# Patient Record
Sex: Female | Born: 1980 | Race: Black or African American | Hispanic: No | Marital: Single | Smoking: Never smoker
Health system: Southern US, Community
[De-identification: ages and names within clinical notes are randomized; demographics above are authoritative.]

## PROBLEM LIST (undated history)

## (undated) ENCOUNTER — Inpatient Hospital Stay (HOSPITAL_COMMUNITY): Payer: Self-pay

## (undated) DIAGNOSIS — O139 Gestational [pregnancy-induced] hypertension without significant proteinuria, unspecified trimester: Secondary | ICD-10-CM

## (undated) DIAGNOSIS — J45909 Unspecified asthma, uncomplicated: Secondary | ICD-10-CM

## (undated) HISTORY — PX: NO PAST SURGERIES: SHX2092

## (undated) HISTORY — DX: Gestational (pregnancy-induced) hypertension without significant proteinuria, unspecified trimester: O13.9

---
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
## 1999-05-27 ENCOUNTER — Other Ambulatory Visit: Admission: RE | Admit: 1999-05-27 | Discharge: 1999-05-27 | Payer: Self-pay | Admitting: Obstetrics and Gynecology

## 2000-11-10 ENCOUNTER — Other Ambulatory Visit: Admission: RE | Admit: 2000-11-10 | Discharge: 2000-11-10 | Payer: Self-pay | Admitting: *Deleted

## 2000-12-12 ENCOUNTER — Inpatient Hospital Stay (HOSPITAL_COMMUNITY): Admission: AD | Admit: 2000-12-12 | Discharge: 2000-12-12 | Payer: Self-pay | Admitting: *Deleted

## 2001-01-08 ENCOUNTER — Emergency Department (HOSPITAL_COMMUNITY): Admission: EM | Admit: 2001-01-08 | Discharge: 2001-01-08 | Payer: Self-pay | Admitting: Emergency Medicine

## 2001-06-18 ENCOUNTER — Inpatient Hospital Stay (HOSPITAL_COMMUNITY): Admission: AD | Admit: 2001-06-18 | Discharge: 2001-06-18 | Payer: Self-pay | Admitting: Obstetrics

## 2001-07-14 ENCOUNTER — Inpatient Hospital Stay (HOSPITAL_COMMUNITY): Admission: AD | Admit: 2001-07-14 | Discharge: 2001-07-16 | Payer: Self-pay | Admitting: Obstetrics

## 2002-04-11 ENCOUNTER — Other Ambulatory Visit: Admission: RE | Admit: 2002-04-11 | Discharge: 2002-04-11 | Payer: Self-pay | Admitting: Obstetrics and Gynecology

## 2003-05-22 ENCOUNTER — Other Ambulatory Visit: Admission: RE | Admit: 2003-05-22 | Discharge: 2003-05-22 | Payer: Self-pay | Admitting: Obstetrics and Gynecology

## 2006-12-23 ENCOUNTER — Emergency Department (HOSPITAL_COMMUNITY): Admission: EM | Admit: 2006-12-23 | Discharge: 2006-12-23 | Payer: Self-pay | Admitting: Emergency Medicine

## 2008-07-20 ENCOUNTER — Emergency Department (HOSPITAL_COMMUNITY): Admission: EM | Admit: 2008-07-20 | Discharge: 2008-07-20 | Payer: Self-pay | Admitting: Family Medicine

## 2010-06-06 ENCOUNTER — Emergency Department (HOSPITAL_COMMUNITY): Admission: EM | Admit: 2010-06-06 | Discharge: 2010-06-06 | Payer: Self-pay | Admitting: Family Medicine

## 2011-02-05 LAB — POCT URINALYSIS DIP (DEVICE)
Bilirubin Urine: NEGATIVE
Glucose, UA: NEGATIVE mg/dL
Nitrite: NEGATIVE
Protein, ur: NEGATIVE mg/dL
Specific Gravity, Urine: 1.02 (ref 1.005–1.030)

## 2011-02-05 LAB — CBC
HCT: 36.5 % (ref 36.0–46.0)
Hemoglobin: 12.8 g/dL (ref 12.0–15.0)
MCHC: 34.9 g/dL (ref 30.0–36.0)
MCV: 99.1 fL (ref 78.0–100.0)
Platelets: 156 10*3/uL (ref 150–400)

## 2011-02-05 LAB — DIFFERENTIAL
Basophils Absolute: 0 10*3/uL (ref 0.0–0.1)
Eosinophils Absolute: 0 10*3/uL (ref 0.0–0.7)
Lymphocytes Relative: 10 % — ABNORMAL LOW (ref 12–46)
Monocytes Absolute: 0.6 10*3/uL (ref 0.1–1.0)
Neutrophils Relative %: 81 % — ABNORMAL HIGH (ref 43–77)

## 2011-06-28 IMAGING — CR DG CHEST 2V
2 series · 2 of 2 positions shown · non-contrast
Comparison: None.

CLINICAL DATA: Chest pain.  Fever.

CHEST - 2 VIEW

[view not recorded (1 of 2)]
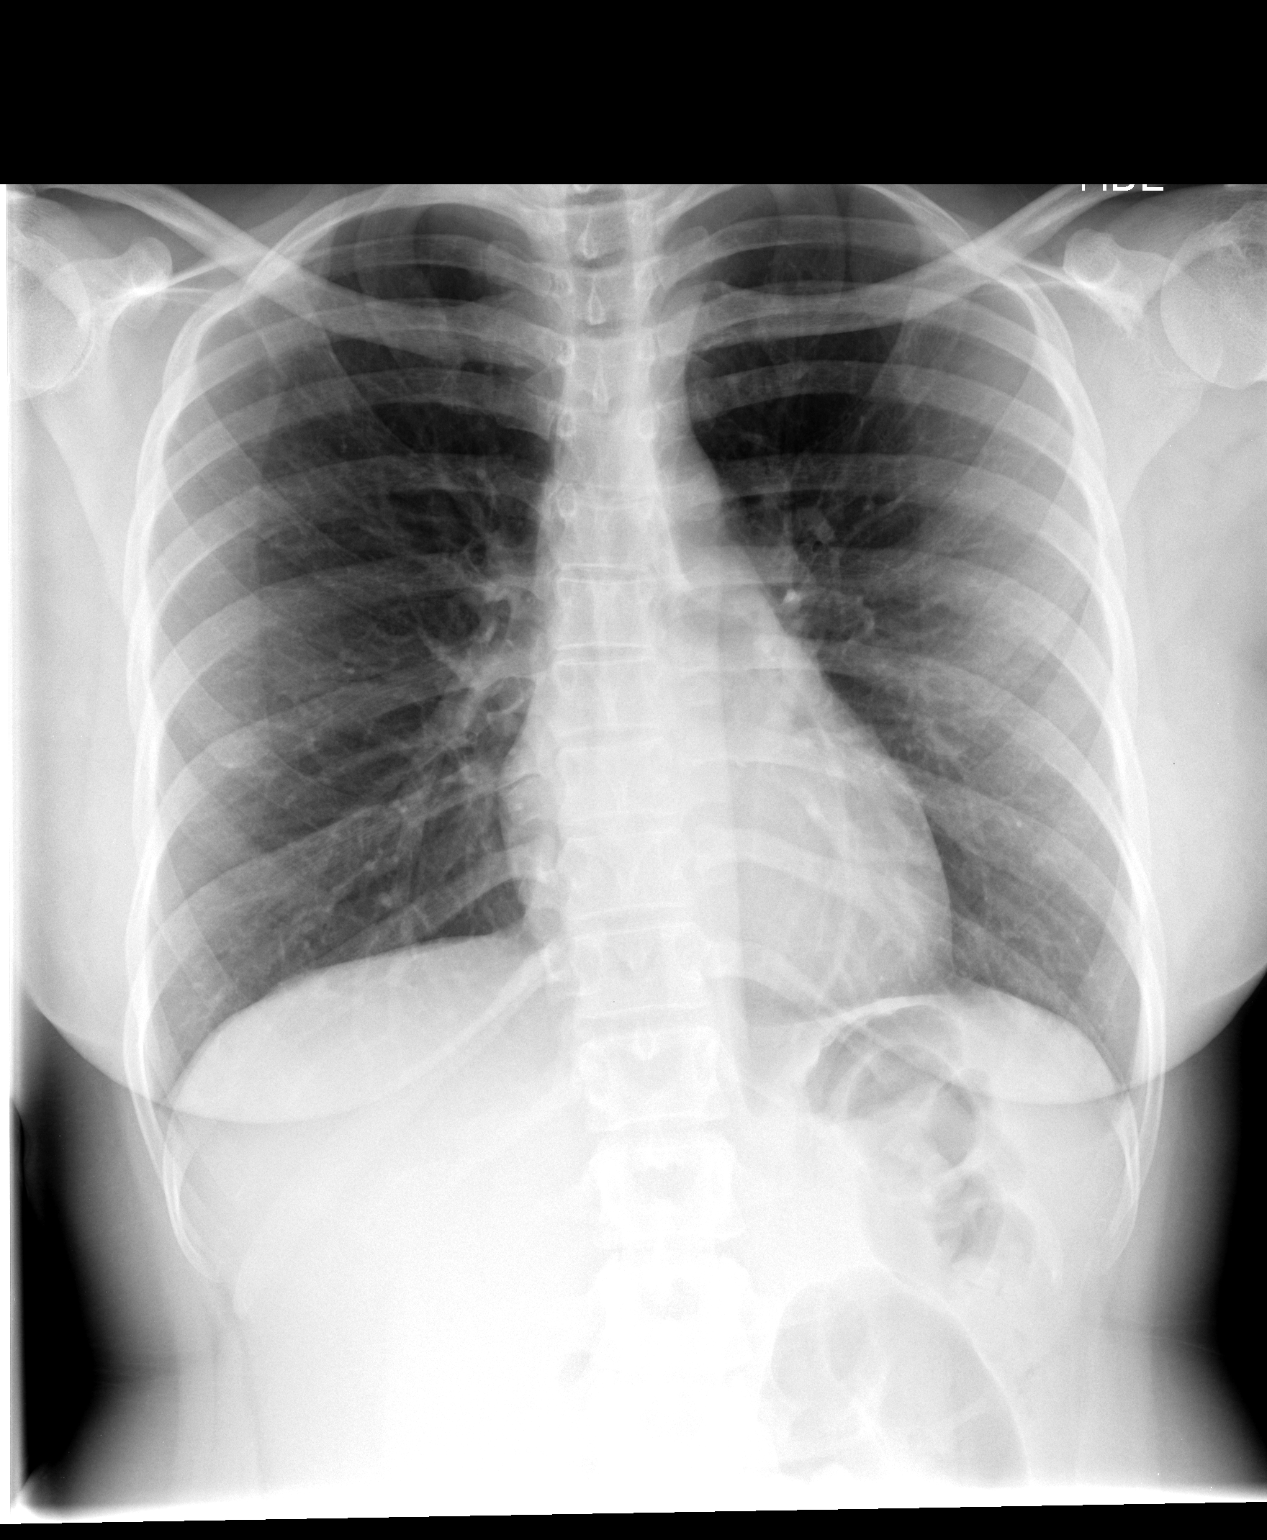

[view not recorded (2 of 2)]
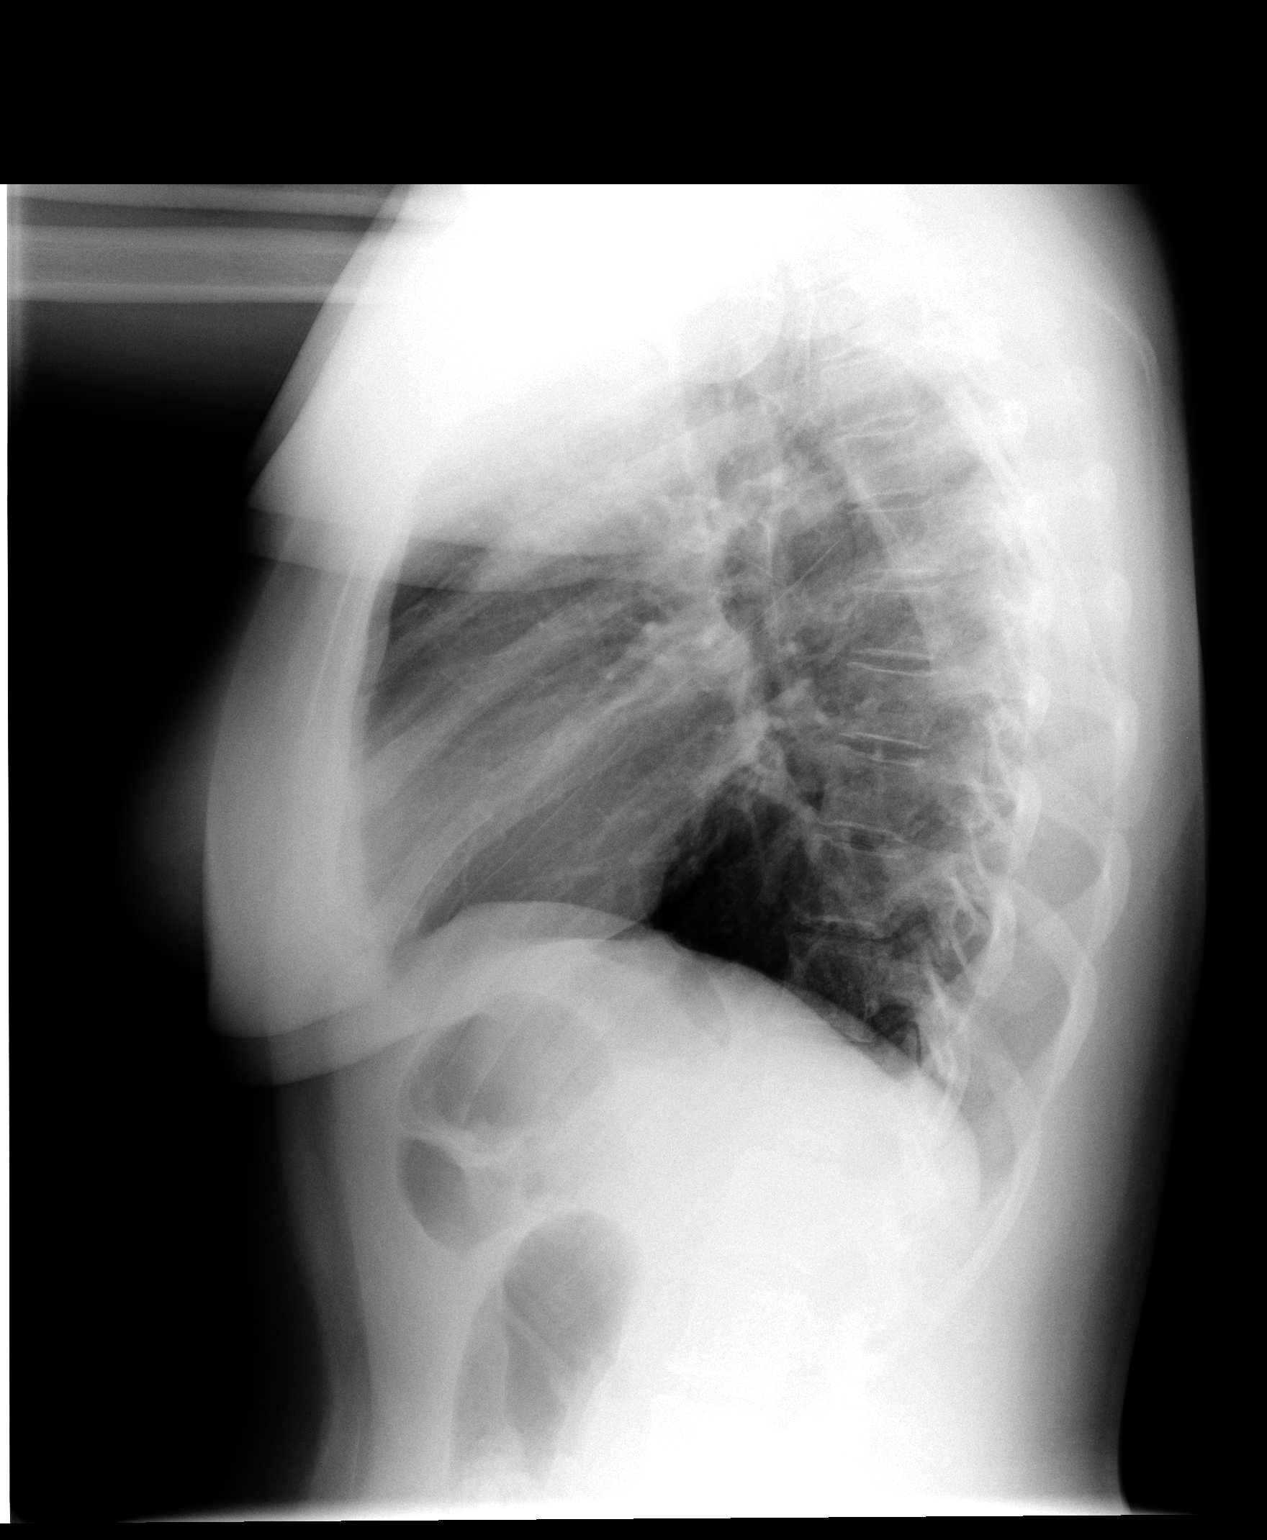

[2 of 2 positions shown; findings below may reference images not displayed]

FINDINGS: Lungs clear.  Cardiopericardial silhouette within normal
limits.  Trachea midline.  No airspace disease or effusion.
IMPRESSION: Negative two-view chest.

## 2016-05-25 ENCOUNTER — Other Ambulatory Visit: Payer: Self-pay | Admitting: Physician Assistant

## 2016-05-25 DIAGNOSIS — R10814 Left lower quadrant abdominal tenderness: Secondary | ICD-10-CM

## 2019-02-05 ENCOUNTER — Encounter (INDEPENDENT_AMBULATORY_CARE_PROVIDER_SITE_OTHER): Payer: Self-pay | Admitting: Ophthalmology

## 2019-03-01 ENCOUNTER — Encounter (INDEPENDENT_AMBULATORY_CARE_PROVIDER_SITE_OTHER): Payer: Self-pay | Admitting: Ophthalmology

## 2019-03-08 ENCOUNTER — Encounter (INDEPENDENT_AMBULATORY_CARE_PROVIDER_SITE_OTHER): Payer: Self-pay | Admitting: Ophthalmology

## 2019-04-09 ENCOUNTER — Encounter (INDEPENDENT_AMBULATORY_CARE_PROVIDER_SITE_OTHER): Payer: Self-pay | Admitting: Ophthalmology

## 2019-07-16 ENCOUNTER — Other Ambulatory Visit: Payer: Self-pay

## 2019-07-16 DIAGNOSIS — Z20822 Contact with and (suspected) exposure to covid-19: Secondary | ICD-10-CM

## 2019-07-17 LAB — NOVEL CORONAVIRUS, NAA: SARS-CoV-2, NAA: NOT DETECTED

## 2023-04-22 ENCOUNTER — Emergency Department (HOSPITAL_COMMUNITY)
Admission: EM | Admit: 2023-04-22 | Discharge: 2023-04-22 | Disposition: A | Discharge status: Home / Self Care | Payer: No Typology Code available for payment source | Attending: Emergency Medicine | Admitting: Emergency Medicine

## 2023-04-22 ENCOUNTER — Encounter (HOSPITAL_COMMUNITY): Payer: Self-pay

## 2023-04-22 ENCOUNTER — Other Ambulatory Visit: Payer: Self-pay

## 2023-04-22 DIAGNOSIS — Z3A11 11 weeks gestation of pregnancy: Secondary | ICD-10-CM | POA: Diagnosis not present

## 2023-04-22 DIAGNOSIS — O99511 Diseases of the respiratory system complicating pregnancy, first trimester: Secondary | ICD-10-CM | POA: Diagnosis not present

## 2023-04-22 DIAGNOSIS — Z20822 Contact with and (suspected) exposure to covid-19: Secondary | ICD-10-CM | POA: Diagnosis not present

## 2023-04-22 DIAGNOSIS — J069 Acute upper respiratory infection, unspecified: Secondary | ICD-10-CM | POA: Diagnosis not present

## 2023-04-22 DIAGNOSIS — O26891 Other specified pregnancy related conditions, first trimester: Secondary | ICD-10-CM | POA: Diagnosis present

## 2023-04-22 LAB — SARS CORONAVIRUS 2 BY RT PCR: SARS Coronavirus 2 by RT PCR: NEGATIVE

## 2023-04-22 LAB — GROUP A STREP BY PCR: Group A Strep by PCR: NOT DETECTED

## 2023-04-22 NOTE — Discharge Instructions (Signed)
Please follow-up with your primary care provider recent symptoms and ER visit.  Today your strep test was negative however you will need to follow-up on your MyChart app in regards to your COVID test.  Please remain hydrated and continue use your Tylenol every 6 hours, Flonase, and perform sinus rinses.  If symptoms worsen or change please return to ER.

## 2023-04-22 NOTE — ED Triage Notes (Signed)
Pt came to ED for congestion that started at 5pm yesterday. C/O HA and ear pain. Pt is [redacted] weeks pregnant. Axox4.

## 2023-04-22 NOTE — ED Provider Notes (Signed)
[place] EMERGENCY DEPARTMENT AT [facility] Provider Note   CSN: [id number] Arrival date & time: 04/22/23  1204     History  Chief Complaint  Patient presents with   Nasal Congestion    Teresa Cantrell is a 42 y.o. female currently [redacted] weeks pregnant presented with nasal congestion and sore throat that began yesterday at 5 PM.  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  Patient states he has been able to eat and drink however hurts to swallow food.  Patient denies any fevers but notes that she does endorse sinus headache.  Patient has tried antihistamines and Flonase to no relief.  Patient does note that she does have seasonal allergies as well.  Patient wants to know if she has strep.  Patient denied any fevers, chills, nausea/vomiting, facial swelling, neck swelling, ear pain, hearing changes, neck pain/stiffness, AMS  Home Medications Prior to Admission medications   Not on File      Allergies    Patient has no allergy information on record.    Review of Systems   Review of Systems See HPI Physical Exam Updated Vital Signs BP (!) 118/90 (BP Location: Right Arm)   Pulse 96   Temp 98.2 F (36.8 C)   Resp 17   Ht 5\' 7"  (1.702 m)   Wt 95.3 kg   SpO2 100%   BMI 32.89 kg/m  Physical Exam Constitutional:      General: She is not in acute distress. HENT:     Right Ear: Tympanic membrane, ear canal and external ear normal.     Left Ear: Tympanic membrane, ear canal and external ear normal.     Nose: Congestion present.     Comments: Sinus tenderness in maxillary sinuses bilaterally    Mouth/Throat:     Mouth: Mucous membranes are moist.     Pharynx: Posterior oropharyngeal erythema present.     Comments: No PTA Floor of mouth is not elevated No swelling noted Tolerating secretions Eyes:     Extraocular Movements: Extraocular movements intact.     Conjunctiva/sclera: Conjunctivae normal.     Pupils:  Pupils are equal, round, and reactive to light.  Musculoskeletal:     Cervical back: Normal range of motion. No rigidity or tenderness.  Lymphadenopathy:     Cervical: No cervical adenopathy.  Neurological:     Mental Status: She is alert.     ED Results / Procedures / Treatments   Labs (all labs ordered are listed, but only abnormal results are displayed) Labs Reviewed  GROUP A STREP BY PCR  SARS CORONAVIRUS 2 BY RT PCR    EKG None  Radiology No results found.  Procedures Procedures    Medications Ordered in ED Medications - No data to display  ED Course/ Medical Decision Making/ A&P                             Medical Decision Making  Tyeasha Kueny 42 y.o. presented today for URI like symptoms. Working DDx that I considered at this time includes, but not limited to, viral illness, peritonsillar abscess, retropharyngeal abscess, pneumonia, meningitis.  R/o DDx: PTA, RPA, pneumonia, meningitis: These diagnoses are not consistent with patient's history, physical exam, lab/imaging findings  Review of prior external notes: 04/10/2023 office visit  Unique Tests and My Interpretation:  Respiratory Panel: Pending Group A Strep: Negative  Discussion  with Independent Historian:  Husband  Discussion of Management of Tests: None  Risk: Low: based on diagnostic testing/clinical impression and treatment plan  Risk Stratification Score: None  Plan: Patient presented for URI-like symptoms.  Patient's physical exam was remarkable for mild tenderness to patient's bilateral maxillary sinuses however the rest of her exam was unremarkable.  I spoke patient has viral illness at the time but since patient is exposed to strep at work a strep panel was ordered along with COVID test.  Patient able this time  Patient's group A strep came back negative.  I spoke to the patient and patient feels comfortable being discharged for the COVID test comes back.  I encouraged patient to use  cough drops, Tylenol every 6 hours and to continue her Flonase and possibly consider sinus rinses as she most likely has a viral illness.  I encouraged patient to follow-up with her primary care provider and to return if symptoms worsen.  Patient was given return precautions.patient stable for discharge at this time.  Patient verbalized understanding of plan.         Final Clinical Impression(s) / ED Diagnoses Final diagnoses:  Upper respiratory tract infection, unspecified type    Rx / DC Orders ED Discharge Orders     None         Remi Deter 04/22/23 1452    Gwyneth Sprout, MD 04/22/23 2056

## 2023-04-27 LAB — OB RESULTS CONSOLE RPR: RPR: NONREACTIVE

## 2023-04-27 LAB — HEPATITIS C ANTIBODY: HCV Ab: NEGATIVE

## 2023-04-27 LAB — OB RESULTS CONSOLE HEPATITIS B SURFACE ANTIGEN: Hepatitis B Surface Ag: NEGATIVE

## 2023-04-27 LAB — OB RESULTS CONSOLE GC/CHLAMYDIA
Chlamydia: NEGATIVE
Neisseria Gonorrhea: NEGATIVE

## 2023-04-27 LAB — OB RESULTS CONSOLE RUBELLA ANTIBODY, IGM: Rubella: IMMUNE

## 2023-04-27 LAB — OB RESULTS CONSOLE HIV ANTIBODY (ROUTINE TESTING): HIV: NONREACTIVE

## 2023-05-22 DIAGNOSIS — Z419 Encounter for procedure for purposes other than remedying health state, unspecified: Secondary | ICD-10-CM | POA: Diagnosis not present

## 2023-06-22 DIAGNOSIS — Z419 Encounter for procedure for purposes other than remedying health state, unspecified: Secondary | ICD-10-CM | POA: Diagnosis not present

## 2023-07-23 DIAGNOSIS — Z419 Encounter for procedure for purposes other than remedying health state, unspecified: Secondary | ICD-10-CM | POA: Diagnosis not present

## 2023-08-22 DIAGNOSIS — Z419 Encounter for procedure for purposes other than remedying health state, unspecified: Secondary | ICD-10-CM | POA: Diagnosis not present

## 2023-09-22 DIAGNOSIS — Z419 Encounter for procedure for purposes other than remedying health state, unspecified: Secondary | ICD-10-CM | POA: Diagnosis not present

## 2023-10-16 ENCOUNTER — Inpatient Hospital Stay (HOSPITAL_COMMUNITY)
Admission: AD | Admit: 2023-10-16 | Discharge: 2023-10-16 | Disposition: A | Discharge status: Home / Self Care | Payer: No Typology Code available for payment source | Attending: Obstetrics and Gynecology | Admitting: Obstetrics and Gynecology

## 2023-10-16 ENCOUNTER — Encounter (HOSPITAL_COMMUNITY): Payer: Self-pay | Admitting: *Deleted

## 2023-10-16 DIAGNOSIS — O4703 False labor before 37 completed weeks of gestation, third trimester: Secondary | ICD-10-CM | POA: Diagnosis present

## 2023-10-16 DIAGNOSIS — O139 Gestational [pregnancy-induced] hypertension without significant proteinuria, unspecified trimester: Secondary | ICD-10-CM | POA: Diagnosis not present

## 2023-10-16 DIAGNOSIS — O4700 False labor before 37 completed weeks of gestation, unspecified trimester: Secondary | ICD-10-CM

## 2023-10-16 DIAGNOSIS — Z79899 Other long term (current) drug therapy: Secondary | ICD-10-CM | POA: Diagnosis not present

## 2023-10-16 DIAGNOSIS — O133 Gestational [pregnancy-induced] hypertension without significant proteinuria, third trimester: Secondary | ICD-10-CM | POA: Diagnosis not present

## 2023-10-16 DIAGNOSIS — Z3A35 35 weeks gestation of pregnancy: Secondary | ICD-10-CM | POA: Diagnosis not present

## 2023-10-16 DIAGNOSIS — T192XXA Foreign body in vulva and vagina, initial encounter: Secondary | ICD-10-CM

## 2023-10-16 LAB — COMPREHENSIVE METABOLIC PANEL
ALT: 29 U/L (ref 0–44)
AST: 31 U/L (ref 15–41)
Albumin: 2.7 g/dL — ABNORMAL LOW (ref 3.5–5.0)
Alkaline Phosphatase: 124 U/L (ref 38–126)
Anion gap: 9 (ref 5–15)
BUN: 6 mg/dL (ref 6–20)
CO2: 21 mmol/L — ABNORMAL LOW (ref 22–32)
Calcium: 9.1 mg/dL (ref 8.9–10.3)
Chloride: 107 mmol/L (ref 98–111)
Creatinine, Ser: 0.8 mg/dL (ref 0.44–1.00)
GFR, Estimated: >60 mL/min (ref >60)
Glucose, Bld: 73 mg/dL (ref 70–99)
Potassium: 3.8 mmol/L (ref 3.5–5.1)
Sodium: 137 mmol/L (ref 135–145)
Total Bilirubin: 0.4 mg/dL (ref <1.2)
Total Protein: 6.1 g/dL — ABNORMAL LOW (ref 6.5–8.1)

## 2023-10-16 LAB — PROTEIN / CREATININE RATIO, URINE
Creatinine, Urine: 39 mg/dL
Total Protein, Urine: <6 mg/dL

## 2023-10-16 LAB — WET PREP, GENITAL
Clue Cells Wet Prep HPF POC: <10 — AB
Sperm: NONE SEEN
Trich, Wet Prep: NONE SEEN
WBC, Wet Prep HPF POC: NONE SEEN — AB (ref <10)
Yeast Wet Prep HPF POC: NONE SEEN

## 2023-10-16 LAB — CBC
HCT: 33.6 % — ABNORMAL LOW (ref 36.0–46.0)
Hemoglobin: 11.3 g/dL — ABNORMAL LOW (ref 12.0–15.0)
MCH: 32.7 pg (ref 26.0–34.0)
MCHC: 33.6 g/dL (ref 30.0–36.0)
MCV: 97.1 fL (ref 80.0–100.0)
Platelets: 148 10*3/uL — ABNORMAL LOW (ref 150–400)
RBC: 3.46 MIL/uL — ABNORMAL LOW (ref 3.87–5.11)
RDW: 12.4 % (ref 11.5–15.5)
WBC: 4.2 10*3/uL (ref 4.0–10.5)
nRBC: 0 % (ref 0.0–0.2)

## 2023-10-16 MED ORDER — LACTATED RINGERS IV BOLUS: 500.0000 mL | Freq: Once | INTRAVENOUS | Status: DC

## 2023-10-16 NOTE — MAU Provider Note (Signed)
GHTN with new severe range Bps in clinic     S [name] is a 42 y.o. G1P0 pregnant female at [redacted]w[redacted]d who presents to MAU today from clinic with complaint of new severe range blood pressure in clinic today. Had verbal sign out form CNM that patient is AMA at 42yo with previous moderate range Bps (157/100) in clinic 11/21 with UPC 0.3 and was thusly started on Labetalol BID.  However today had severe range Bps and reactive NST so wanted her to present for concern for severe PreE. BP 160/100 > 147/100 today in clinic.    Upon presentation to MAU, denies HA, visual changes, CP, SOB< RUQ pain.  Endorses increased swelling in lower extremities since starting third trimester. Denied VB, LOF.  Endorses +FM. BP noted to be 144/92 upon triage with pulse rate of 77.   Receives care at [facility]. Prenatal records reviewed.  Pertinent items noted in HPI and remainder of comprehensive ROS otherwise negative.   O BP (!) 144/92 (BP Location: Right Arm)   Pulse 77   Temp 98.6 F (37 C) (Oral)   Resp 18   Ht 5\' 7"  (1.702 m)   Wt 117.5 kg   SpO2 100%   BMI 40.57 kg/m  Physical Exam Vitals and nursing note reviewed.  Constitutional:      General: She is not in acute distress.    Appearance: Normal appearance. She is obese. She is not ill-appearing.  HENT:     Head: Normocephalic and atraumatic.     Nose: Nose normal. No congestion or rhinorrhea.     Mouth/Throat:     Mouth: Mucous membranes are moist.     Pharynx: Oropharynx is clear.  Eyes:     General:        Right eye: No discharge.        Left eye: No discharge.     Extraocular Movements: Extraocular movements intact.     Conjunctiva/sclera: Conjunctivae normal.  Cardiovascular:     Rate and Rhythm: Normal rate.  Pulmonary:     Effort: Pulmonary effort is normal. No respiratory distress.  Abdominal:     General: Abdomen is flat. There is no distension.     Palpations: Abdomen is soft.     Tenderness: There is no abdominal  tenderness.     Comments: Gravid   Genitourinary:    Comments: SVD C/50/B Musculoskeletal:        General: Swelling present. Normal range of motion.     Cervical back: Normal range of motion.     Right lower leg: Edema (+1 to high thigh) present.     Left lower leg: Edema (+1 to high thigh) present.  Skin:    General: Skin is warm and dry.  Neurological:     Mental Status: She is alert and oriented to person, place, and time. Mental status is at baseline.     Motor: No weakness.     Gait: Gait normal.  Psychiatric:        Mood and Affect: Mood normal.        Behavior: Behavior normal.        Thought Content: Thought content normal.        Judgment: Judgment normal.      MDM: MAU Course:  CMP Cr 0.8, LFTs 31/29 CBC plts 148 UPC below reportable range   Patient with only mild to moderate range pressures here and have improved since being her.  Remains asymptomatic.   NST 130bpm,  moderate variability, +accels, no decels, CTX q3-37mins   Pt states not feeling any of the contractions and reassuring cervical exam as above.  Of note, did find a circular white piece of plastic about the size of nickel that patient states is a snap off a body suite.  She was unaware of the plastic piece being there and unsure of how long it has been there.  Will collect UA and self swabs as well to treat any brewing infection possibly contributing to uterine irritability see today.  Spoke with Dr. Wendall Stade who is agreeable with discharge at this time with close OP follow up being planned in next couple of days.     A&P: #35 weeks #GHTN  - continue previously prescribed Labetalol   #Preterm contractions - f/u swabs and UA - preterm labor precautions     Discharge from MAU in stable condition with strict / usual precautions Follow up at [facility] as scheduled for ongoing prenatal care  Allergies as of 10/16/2023   Not on File      Medication List    You have not been prescribed any  medications.     Hessie Dibble, MD 10/16/2023 12:48 PM

## 2023-10-16 NOTE — MAU Note (Addendum)
Pt is a G1P0 at 35.2 weeks denies any previous hypertension. BP increased noted at last visit and medication started.  BP elevated again today at office visit, sent for monitoring.  Denies any neuro or PIH symptoms.  Last took labetalol BID 100mg  at 10a

## 2023-10-16 NOTE — MAU Note (Signed)
Teresa Cantrell is a 42 y.o. at [redacted]w[redacted]d here in MAU reporting: Last Thur, BP 157/100, sent home on BP medication.never had BP elevation before, ever.   Went in for stress test this morning and check BP.  Baby looked really good but her BP was 160/100, then 147/100.  Last Thur, BP 157/100, sent home on BP medication.  No HA, visual changes,epigastric pain, reports increase in swelling in third trimester.  No bleeding or leaking. Denies pain. Reports +FM  Onset of complaint: last Thur Pain score: none Vitals:   10/16/23 1229  BP: (!) 144/92  Pulse: 77  Resp: 18  Temp: 98.6 F (37 C)  SpO2: 100%     FHT:162 Lab orders placed from triage:  urine collected

## 2023-10-17 LAB — GC/CHLAMYDIA PROBE AMP (~~LOC~~) NOT AT ARMC
Chlamydia: NEGATIVE
Comment: NEGATIVE
Comment: NORMAL
Neisseria Gonorrhea: NEGATIVE

## 2023-10-18 ENCOUNTER — Inpatient Hospital Stay (HOSPITAL_COMMUNITY)
Admission: AD | Admit: 2023-10-18 | Discharge: 2023-10-18 | Disposition: A | Discharge status: Home / Self Care | Payer: PRIVATE HEALTH INSURANCE | Attending: Obstetrics | Admitting: Obstetrics

## 2023-10-18 ENCOUNTER — Encounter (HOSPITAL_COMMUNITY): Payer: Self-pay | Admitting: Obstetrics

## 2023-10-18 DIAGNOSIS — J45909 Unspecified asthma, uncomplicated: Secondary | ICD-10-CM | POA: Insufficient documentation

## 2023-10-18 DIAGNOSIS — J00 Acute nasopharyngitis [common cold]: Secondary | ICD-10-CM | POA: Insufficient documentation

## 2023-10-18 DIAGNOSIS — Z711 Person with feared health complaint in whom no diagnosis is made: Secondary | ICD-10-CM

## 2023-10-18 DIAGNOSIS — O133 Gestational [pregnancy-induced] hypertension without significant proteinuria, third trimester: Secondary | ICD-10-CM | POA: Insufficient documentation

## 2023-10-18 DIAGNOSIS — Z1152 Encounter for screening for COVID-19: Secondary | ICD-10-CM | POA: Insufficient documentation

## 2023-10-18 DIAGNOSIS — O113 Pre-existing hypertension with pre-eclampsia, third trimester: Secondary | ICD-10-CM | POA: Diagnosis not present

## 2023-10-18 DIAGNOSIS — Z3A35 35 weeks gestation of pregnancy: Secondary | ICD-10-CM | POA: Insufficient documentation

## 2023-10-18 DIAGNOSIS — O09523 Supervision of elderly multigravida, third trimester: Secondary | ICD-10-CM | POA: Insufficient documentation

## 2023-10-18 DIAGNOSIS — O10913 Unspecified pre-existing hypertension complicating pregnancy, third trimester: Secondary | ICD-10-CM | POA: Diagnosis present

## 2023-10-18 HISTORY — DX: Unspecified asthma, uncomplicated: J45.909

## 2023-10-18 LAB — COMPREHENSIVE METABOLIC PANEL
ALT: 28 U/L (ref 0–44)
AST: 26 U/L (ref 15–41)
Albumin: 2.6 g/dL — ABNORMAL LOW (ref 3.5–5.0)
Alkaline Phosphatase: 113 U/L (ref 38–126)
Anion gap: 8 (ref 5–15)
BUN: 6 mg/dL (ref 6–20)
CO2: 19 mmol/L — ABNORMAL LOW (ref 22–32)
Calcium: 9.2 mg/dL (ref 8.9–10.3)
Chloride: 108 mmol/L (ref 98–111)
Creatinine, Ser: 0.72 mg/dL (ref 0.44–1.00)
GFR, Estimated: >60 mL/min (ref >60)
Glucose, Bld: 101 mg/dL — ABNORMAL HIGH (ref 70–99)
Potassium: 3.9 mmol/L (ref 3.5–5.1)
Sodium: 135 mmol/L (ref 135–145)
Total Bilirubin: 0.5 mg/dL (ref <1.2)
Total Protein: 6.2 g/dL — ABNORMAL LOW (ref 6.5–8.1)

## 2023-10-18 LAB — CBC
HCT: 32.9 % — ABNORMAL LOW (ref 36.0–46.0)
Hemoglobin: 11.2 g/dL — ABNORMAL LOW (ref 12.0–15.0)
MCH: 33.1 pg (ref 26.0–34.0)
MCHC: 34 g/dL (ref 30.0–36.0)
MCV: 97.3 fL (ref 80.0–100.0)
Platelets: 152 10*3/uL (ref 150–400)
RBC: 3.38 MIL/uL — ABNORMAL LOW (ref 3.87–5.11)
RDW: 12.2 % (ref 11.5–15.5)
WBC: 5.3 10*3/uL (ref 4.0–10.5)
nRBC: 0 % (ref 0.0–0.2)

## 2023-10-18 LAB — PROTEIN / CREATININE RATIO, URINE
Creatinine, Urine: 19 mg/dL
Total Protein, Urine: <6 mg/dL

## 2023-10-18 LAB — RESP PANEL BY RT-PCR (RSV, FLU A&B, COVID)  RVPGX2
Influenza A by PCR: NEGATIVE
Influenza B by PCR: NEGATIVE
Resp Syncytial Virus by PCR: NEGATIVE
SARS Coronavirus 2 by RT PCR: NEGATIVE

## 2023-10-18 MED ORDER — BENZONATATE 100 MG PO CAPS: 100.0000 mg | ORAL_CAPSULE | Freq: Four times a day (QID) | ORAL | 0 refills | Status: DC | PRN

## 2023-10-18 MED ORDER — NIFEDIPINE 10 MG PO CAPS: 20.0000 mg | ORAL_CAPSULE | ORAL | Status: DC | PRN

## 2023-10-18 MED ORDER — ALBUTEROL SULFATE HFA 108 (90 BASE) MCG/ACT IN AERS: 2.0000 | INHALATION_SPRAY | Freq: Four times a day (QID) | RESPIRATORY_TRACT | 2 refills | Status: DC | PRN

## 2023-10-18 MED ORDER — LABETALOL HCL 5 MG/ML IV SOLN: 40.0000 mg | INTRAVENOUS | Status: DC | PRN

## 2023-10-18 MED ORDER — LABETALOL HCL 100 MG PO TABS
100.0000 mg | ORAL_TABLET | Freq: Once | ORAL | Status: AC
  Administered 2023-10-18: 100 mg via ORAL
  Filled 2023-10-18: qty 1

## 2023-10-18 MED ORDER — NIFEDIPINE 10 MG PO CAPS: 10.0000 mg | ORAL_CAPSULE | ORAL | Status: DC | PRN

## 2023-10-18 MED ORDER — ALBUTEROL SULFATE HFA 108 (90 BASE) MCG/ACT IN AERS: 2.0000 | INHALATION_SPRAY | Freq: Four times a day (QID) | RESPIRATORY_TRACT | 1 refills | Status: AC | PRN

## 2023-10-18 MED ORDER — LABETALOL HCL 100 MG PO TABS
200.0000 mg | ORAL_TABLET | Freq: Two times a day (BID) | ORAL | 2 refills | Status: DC
Start: 2023-10-18 — End: 2023-10-30

## 2023-10-18 MED ORDER — BENZONATATE 100 MG PO CAPS: 100.0000 mg | ORAL_CAPSULE | Freq: Four times a day (QID) | ORAL | 1 refills | Status: DC | PRN

## 2023-10-18 NOTE — MAU Provider Note (Addendum)
History  Teresa Cantrell , a  42 y.o. G2P0101 at [redacted]w[redacted]d presents to MAU for HBP in the office. Patient denies any BP issues this pregnancy but notes her BP was high on Thursday when she went in for her appointment, which was after taking Robitussin for a cough she had developed. She was prescribed Labetalol 100mg  BID at this time. She reports this cough has been consistent since, nonproductive with left ear pressure. She denies fever, chills or body aches. 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 She does report a HA 2 weeks ago of which was relieved with Tylenol. She denies dizziness, visual disturbances, or RUQ pain.  Contractions: None; FM: present; VB: None; LOF: None; N/V/D: None  RN note: [name] is a 42 y.o. at [redacted]w[redacted]d here in MAU reporting: Sent over from the office for BP evaluation. Denies HA, visual disturbances, and RUQ/epigastric pain. She reports bilateral swelling of her feet for two weeks now. She reports she has been coughing for one week and would like to be checked for pneumonia. Denies VB or LOF. +FM.   Was seen in MAU on 11/25 for the same issue.  Takes Labetalol 100 mg BID. Last dose at 1000 this morning. She reports her MD told her that she should be taking 250 mg TID.    Onset of complaint: Today Pain score: Denies pain     CSN: [id number]  Arrival date and time: 10/18/23 1755   Event Date/Time   First Provider Initiated Contact with Patient 10/18/23 1929      Chief Complaint  Patient presents with   Hypertension   Hypertension This is a new problem. The current episode started 1 to 4 weeks ago. The problem is unchanged. The problem is uncontrolled. Pertinent negatives include no chest pain or headaches. There are no associated agents to hypertension. Risk factors for coronary artery disease include sedentary lifestyle, stress and obesity. Past treatments include nothing. There are no compliance  problems.     OB History     Gravida  2   Para  1   Term  0   Preterm  1   AB      Living  1      SAB      IAB      Ectopic      Multiple      Live Births  1           Past Medical History:  Diagnosis Date   Asthma     Past Surgical History:  Procedure Laterality Date   NO PAST SURGERIES      No family history on file.  Social History   Tobacco Use   Smoking status: Never   Smokeless tobacco: Never  Vaping Use   Vaping status: Never Used  Substance Use Topics   Alcohol use: Never   Drug use: Never    Allergies:  Allergies  Allergen Reactions   Pineapple Anaphylaxis    Needs epi pen    Citric Acid Swelling    Medications Prior to Admission  Medication Sig Dispense Refill Last Dose   aspirin EC 81 MG tablet Take 81 mg by mouth daily. Swallow whole.      folic acid (FOLVITE) 1 MG tablet Take 1 mg by mouth daily.      labetalol (NORMODYNE) 100 MG tablet Take 100 mg by mouth 2 (two) times daily.      Prenatal  Vit-Fe Fumarate-FA (PRENATAL MULTIVITAMIN) TABS tablet Take 1 tablet by mouth daily at 12 noon.       Review of Systems  Constitutional:  Negative for chills and fever.  HENT:  Negative for congestion, ear pain, rhinorrhea, sinus pressure, sinus pain, sneezing and sore throat.   Respiratory:  Positive for cough.   Cardiovascular:  Negative for chest pain.  Gastrointestinal:  Negative for abdominal pain, diarrhea, nausea and vomiting.  Neurological:  Negative for dizziness and headaches.   Physical Exam   Blood pressure (!) 151/91, pulse 73, temperature 98 F (36.7 C), temperature source Oral, resp. rate 18, height 5\' 7"  (1.702 m), weight 118.1 kg, SpO2 97%.  Physical Exam Constitutional:      General: She is not in acute distress.    Appearance: She is not ill-appearing.  Cardiovascular:     Rate and Rhythm: Normal rate.  Pulmonary:     Effort: Pulmonary effort is normal.  Skin:    General: Skin is warm and dry.   Neurological:     Mental Status: She is oriented to person, place, and time.  Psychiatric:        Mood and Affect: Mood normal.        Behavior: Behavior normal.     MAU Course  Procedures Orders Placed This Encounter  Procedures   Resp panel by RT-PCR (RSV, Flu A&B, Covid) Anterior Nasal Swab   Comprehensive metabolic panel   CBC   Protein / creatinine ratio, urine   Notify physician (specify) Confirmatory reading of BP> 160/110 15 minutes later   Apply Hypertensive Disorders of Pregnancy Care Plan   Measure blood pressure   Droplet precaution    Results for orders placed or performed during the hospital encounter of 10/18/23 (from the past 24 hour(s))  Comprehensive metabolic panel     Status: Abnormal   Collection Time: 10/18/23  6:16 PM  Result Value Ref Range   Sodium 135 135 - 145 mmol/L   Potassium 3.9 3.5 - 5.1 mmol/L   Chloride 108 98 - 111 mmol/L   CO2 19 (L) 22 - 32 mmol/L   Glucose, Bld 101 (H) 70 - 99 mg/dL   BUN 6 6 - 20 mg/dL   Creatinine, Ser 0.86 0.44 - 1.00 mg/dL   Calcium 9.2 8.9 - 57.8 mg/dL   Total Protein 6.2 (L) 6.5 - 8.1 g/dL   Albumin 2.6 (L) 3.5 - 5.0 g/dL   AST 26 15 - 41 U/L   ALT 28 0 - 44 U/L   Alkaline Phosphatase 113 38 - 126 U/L   Total Bilirubin 0.5 <1.2 mg/dL   GFR, Estimated >46 >96 mL/min   Anion gap 8 5 - 15  CBC     Status: Abnormal   Collection Time: 10/18/23  6:16 PM  Result Value Ref Range   WBC 5.3 4.0 - 10.5 K/uL   RBC 3.38 (L) 3.87 - 5.11 MIL/uL   Hemoglobin 11.2 (L) 12.0 - 15.0 g/dL   HCT 29.5 (L) 28.4 - 13.2 %   MCV 97.3 80.0 - 100.0 fL   MCH 33.1 26.0 - 34.0 pg   MCHC 34.0 30.0 - 36.0 g/dL   RDW 44.0 10.2 - 72.5 %   Platelets 152 150 - 400 K/uL   nRBC 0.0 0.0 - 0.2 %  Protein / creatinine ratio, urine     Status: None   Collection Time: 10/18/23  6:30 PM  Result Value Ref Range   Creatinine, Urine 19 mg/dL  Total Protein, Urine <6 mg/dL   Protein Creatinine Ratio        0.00 - 0.15 mg/mg[Cre]  Resp panel by  RT-PCR (RSV, Flu A&B, Covid) Anterior Nasal Swab     Status: None   Collection Time: 10/18/23  7:18 PM   Specimen: Anterior Nasal Swab  Result Value Ref Range   SARS Coronavirus 2 by RT PCR NEGATIVE NEGATIVE   Influenza A by PCR NEGATIVE NEGATIVE   Influenza B by PCR NEGATIVE NEGATIVE   Resp Syncytial Virus by PCR NEGATIVE NEGATIVE    NST: Reactive Cat I Mode: EFM FHR: 140s Variability: Mod Accels: 15x15 Decels: None Scalp Stim: N/A Contractions: None      MDM Gestational Hypertension Pre-Eclampsia 3. Common cold -CMP: unremarkable  -CBC: unremarkable -Protein/creat ratio: wnl -Respiratory panel: Negative -Increased home dose of Labetalol to 200mg  BID after consulting with Dr. Juliene Pina Low suspicion for Pre-E at this time Uncontrolled gHTN Cough, without nasal drainage/stuffiness. Patient with a hx of asthma. Rescue inhaler previously prescribed reordered.  Assessment and Plan  Gestational Hypertension, without proteinuria History of Asthma, affecting pregnancy, third trimester -Labetalol 200mg  BID -Advised OTC cough suppressants -Advised pt to notify if increase in need of rescue inhaler more than 3x/week -Return precautions given and reviewed  Darrell Jewel, SNM 10/18/2023, 8:01 PM

## 2023-10-18 NOTE — MAU Note (Signed)
.  Teresa Cantrell is a 42 y.o. at [redacted]w[redacted]d here in MAU reporting: Sent over from the office for BP evaluation. Denies HA, visual disturbances, and RUQ/epigastric pain. She reports bilateral swelling of her feet for two weeks now. She reports she has been coughing for one week and would like to be checked for pneumonia. Denies VB or LOF. +FM.  Was seen in MAU on 11/25 for the same issue.  Takes Labetalol 100 mg BID. Last dose at 1000 this morning. She reports her MD told her that she should be taking 250 mg TID.   Onset of complaint: Today Pain score: Denies pain   Vitals:   10/18/23 1811  BP: (!) 153/89  Pulse: 84  Resp: 18  Temp: 98 F (36.7 C)  SpO2: 99%     FHT: 135 initial external Lab orders placed from triage: UA

## 2023-10-22 DIAGNOSIS — Z419 Encounter for procedure for purposes other than remedying health state, unspecified: Secondary | ICD-10-CM | POA: Diagnosis not present

## 2023-10-23 LAB — OB RESULTS CONSOLE GBS: GBS: POSITIVE

## 2023-10-24 ENCOUNTER — Telehealth (HOSPITAL_COMMUNITY): Payer: Self-pay | Admitting: *Deleted

## 2023-10-24 NOTE — Telephone Encounter (Signed)
Preadmission screen  

## 2023-10-25 ENCOUNTER — Encounter (HOSPITAL_COMMUNITY): Payer: Self-pay | Admitting: *Deleted

## 2023-10-26 ENCOUNTER — Encounter (HOSPITAL_COMMUNITY)
Admission: RE | Disposition: A | Discharge status: Home / Self Care | Payer: Self-pay | Source: Home / Self Care | Attending: Obstetrics and Gynecology

## 2023-10-26 ENCOUNTER — Encounter (HOSPITAL_COMMUNITY): Payer: Self-pay | Admitting: Obstetrics and Gynecology

## 2023-10-26 ENCOUNTER — Inpatient Hospital Stay (HOSPITAL_COMMUNITY): Payer: No Typology Code available for payment source | Admitting: Anesthesiology

## 2023-10-26 ENCOUNTER — Other Ambulatory Visit: Payer: Self-pay

## 2023-10-26 ENCOUNTER — Inpatient Hospital Stay (HOSPITAL_COMMUNITY)
Admission: RE | Admit: 2023-10-26 | Discharge: 2023-10-30 | DRG: 787 | Disposition: A | Discharge status: Home / Self Care | Payer: No Typology Code available for payment source | Attending: Obstetrics and Gynecology | Admitting: Obstetrics and Gynecology

## 2023-10-26 DIAGNOSIS — Z3A36 36 weeks gestation of pregnancy: Secondary | ICD-10-CM

## 2023-10-26 DIAGNOSIS — D62 Acute posthemorrhagic anemia: Secondary | ICD-10-CM | POA: Diagnosis not present

## 2023-10-26 DIAGNOSIS — O133 Gestational [pregnancy-induced] hypertension without significant proteinuria, third trimester: Principal | ICD-10-CM

## 2023-10-26 DIAGNOSIS — O1414 Severe pre-eclampsia complicating childbirth: Secondary | ICD-10-CM | POA: Diagnosis present

## 2023-10-26 DIAGNOSIS — Z3490 Encounter for supervision of normal pregnancy, unspecified, unspecified trimester: Secondary | ICD-10-CM | POA: Diagnosis present

## 2023-10-26 DIAGNOSIS — O1494 Unspecified pre-eclampsia, complicating childbirth: Secondary | ICD-10-CM | POA: Diagnosis present

## 2023-10-26 DIAGNOSIS — O3663X0 Maternal care for excessive fetal growth, third trimester, not applicable or unspecified: Secondary | ICD-10-CM | POA: Diagnosis present

## 2023-10-26 DIAGNOSIS — O99214 Obesity complicating childbirth: Secondary | ICD-10-CM | POA: Diagnosis present

## 2023-10-26 DIAGNOSIS — O9952 Diseases of the respiratory system complicating childbirth: Secondary | ICD-10-CM | POA: Diagnosis present

## 2023-10-26 DIAGNOSIS — O9081 Anemia of the puerperium: Secondary | ICD-10-CM | POA: Diagnosis not present

## 2023-10-26 DIAGNOSIS — O99824 Streptococcus B carrier state complicating childbirth: Secondary | ICD-10-CM | POA: Diagnosis present

## 2023-10-26 DIAGNOSIS — E66813 Obesity, class 3: Secondary | ICD-10-CM | POA: Diagnosis present

## 2023-10-26 DIAGNOSIS — J45909 Unspecified asthma, uncomplicated: Secondary | ICD-10-CM | POA: Diagnosis present

## 2023-10-26 DIAGNOSIS — O1410 Severe pre-eclampsia, unspecified trimester: Secondary | ICD-10-CM | POA: Diagnosis present

## 2023-10-26 DIAGNOSIS — Z98891 History of uterine scar from previous surgery: Secondary | ICD-10-CM

## 2023-10-26 LAB — CBC
HCT: 34.5 % — ABNORMAL LOW (ref 36.0–46.0)
HCT: 27.8 % — ABNORMAL LOW (ref 36.0–46.0)
Hemoglobin: 12.1 g/dL (ref 12.0–15.0)
Hemoglobin: 9.2 g/dL — ABNORMAL LOW (ref 12.0–15.0)
MCH: 34.2 pg — ABNORMAL HIGH (ref 26.0–34.0)
MCH: 32.6 pg (ref 26.0–34.0)
MCHC: 35.1 g/dL (ref 30.0–36.0)
MCHC: 33.1 g/dL (ref 30.0–36.0)
MCV: 97.5 fL (ref 80.0–100.0)
MCV: 98.6 fL (ref 80.0–100.0)
Platelets: 123 10*3/uL — ABNORMAL LOW (ref 150–400)
Platelets: 102 10*3/uL — ABNORMAL LOW (ref 150–400)
RBC: 3.54 MIL/uL — ABNORMAL LOW (ref 3.87–5.11)
RBC: 2.82 MIL/uL — ABNORMAL LOW (ref 3.87–5.11)
RDW: 12.2 % (ref 11.5–15.5)
RDW: 12.1 % (ref 11.5–15.5)
WBC: 5 10*3/uL (ref 4.0–10.5)
WBC: 8.4 10*3/uL (ref 4.0–10.5)
nRBC: 0 % (ref 0.0–0.2)
nRBC: 0 % (ref 0.0–0.2)

## 2023-10-26 LAB — COMPREHENSIVE METABOLIC PANEL
ALT: 55 U/L — ABNORMAL HIGH (ref 0–44)
AST: 53 U/L — ABNORMAL HIGH (ref 15–41)
Albumin: 2.7 g/dL — ABNORMAL LOW (ref 3.5–5.0)
Alkaline Phosphatase: 137 U/L — ABNORMAL HIGH (ref 38–126)
Anion gap: 9 (ref 5–15)
BUN: 7 mg/dL (ref 6–20)
CO2: 21 mmol/L — ABNORMAL LOW (ref 22–32)
Calcium: 9.2 mg/dL (ref 8.9–10.3)
Chloride: 106 mmol/L (ref 98–111)
Creatinine, Ser: 0.77 mg/dL (ref 0.44–1.00)
GFR, Estimated: >60 mL/min (ref >60)
Glucose, Bld: 72 mg/dL (ref 70–99)
Potassium: 4.1 mmol/L (ref 3.5–5.1)
Sodium: 136 mmol/L (ref 135–145)
Total Bilirubin: 0.7 mg/dL (ref <1.2)
Total Protein: 5.9 g/dL — ABNORMAL LOW (ref 6.5–8.1)

## 2023-10-26 LAB — PROTEIN / CREATININE RATIO, URINE
Creatinine, Urine: 24 mg/dL
Protein Creatinine Ratio: 1.88 mg/mg{creat} — ABNORMAL HIGH (ref 0.00–0.15)
Total Protein, Urine: 45 mg/dL

## 2023-10-26 LAB — TYPE AND SCREEN
ABO/RH(D): O POS
Antibody Screen: NEGATIVE

## 2023-10-26 LAB — URIC ACID: Uric Acid, Serum: 6.5 mg/dL (ref 2.5–7.1)

## 2023-10-26 SURGERY — Surgical Case
Anesthesia: Epidural | Site: Abdomen

## 2023-10-26 MED ORDER — LIDOCAINE-EPINEPHRINE (PF) 1.5 %-1:200000 IJ SOLN
INTRAMUSCULAR | Status: DC | PRN
  Administered 2023-10-26 (×2): 5 mL via EPIDURAL

## 2023-10-26 MED ORDER — KETOROLAC TROMETHAMINE 30 MG/ML IJ SOLN
30.0000 mg | Freq: Four times a day (QID) | INTRAMUSCULAR | Status: AC
  Administered 2023-10-26 – 2023-10-27 (×4): 30 mg via INTRAVENOUS
  Filled 2023-10-26 (×4): qty 1

## 2023-10-26 MED ORDER — LACTATED RINGERS IV SOLN
INTRAVENOUS | Status: DC
Start: 2023-10-26 — End: 2023-10-26

## 2023-10-26 MED ORDER — FENTANYL CITRATE (PF) 100 MCG/2ML IJ SOLN
INTRAMUSCULAR | Status: AC
  Filled 2023-10-26: qty 2

## 2023-10-26 MED ORDER — OXYTOCIN-SODIUM CHLORIDE 30-0.9 UT/500ML-% IV SOLN
INTRAVENOUS | Status: DC | PRN
  Administered 2023-10-26: 400 mL via INTRAVENOUS

## 2023-10-26 MED ORDER — LACTATED RINGERS IV SOLN
500.0000 mL | Freq: Once | INTRAVENOUS | Status: AC
  Administered 2023-10-26: 500 mL via INTRAVENOUS

## 2023-10-26 MED ORDER — SODIUM CHLORIDE 0.9% FLUSH
3.0000 mL | INTRAVENOUS | Status: DC | PRN
  Administered 2023-10-28: 3 mL via INTRAVENOUS

## 2023-10-26 MED ORDER — LABETALOL HCL 5 MG/ML IV SOLN: 20.0000 mg | INTRAVENOUS | Status: DC | PRN

## 2023-10-26 MED ORDER — MAGNESIUM SULFATE BOLUS VIA INFUSION
4.0000 g | Freq: Once | INTRAVENOUS | Status: AC
  Administered 2023-10-26: 4 g via INTRAVENOUS
  Filled 2023-10-26: qty 1000

## 2023-10-26 MED ORDER — ACETAMINOPHEN 10 MG/ML IV SOLN
INTRAVENOUS | Status: AC
  Filled 2023-10-26: qty 100

## 2023-10-26 MED ORDER — LABETALOL HCL 5 MG/ML IV SOLN: 80.0000 mg | INTRAVENOUS | Status: DC | PRN

## 2023-10-26 MED ORDER — ACETAMINOPHEN 10 MG/ML IV SOLN
INTRAVENOUS | Status: DC | PRN
  Administered 2023-10-26: 1000 mg via INTRAVENOUS

## 2023-10-26 MED ORDER — FENTANYL CITRATE (PF) 100 MCG/2ML IJ SOLN: 50.0000 ug | INTRAMUSCULAR | Status: DC | PRN

## 2023-10-26 MED ORDER — MORPHINE SULFATE (PF) 0.5 MG/ML IJ SOLN
INTRAMUSCULAR | Status: AC
  Filled 2023-10-26: qty 10

## 2023-10-26 MED ORDER — LIDOCAINE HCL (PF) 1 % IJ SOLN
INTRAMUSCULAR | Status: DC | PRN
  Administered 2023-10-26: 5 mL via EPIDURAL

## 2023-10-26 MED ORDER — SODIUM CHLORIDE 0.9 % IV SOLN
5.0000 10*6.[IU] | Freq: Once | INTRAVENOUS | Status: AC
  Administered 2023-10-26: 5 10*6.[IU] via INTRAVENOUS
  Filled 2023-10-26: qty 5

## 2023-10-26 MED ORDER — OXYTOCIN BOLUS FROM INFUSION: 333.0000 mL | Freq: Once | INTRAVENOUS | Status: DC

## 2023-10-26 MED ORDER — NALOXONE HCL 0.4 MG/ML IJ SOLN: 0.4000 mg | INTRAMUSCULAR | Status: DC | PRN

## 2023-10-26 MED ORDER — MORPHINE SULFATE (PF) 0.5 MG/ML IJ SOLN
INTRAMUSCULAR | Status: DC | PRN
  Administered 2023-10-26: 3 mg via EPIDURAL

## 2023-10-26 MED ORDER — DEXMEDETOMIDINE HCL IN NACL 80 MCG/20ML IV SOLN
INTRAVENOUS | Status: AC
  Filled 2023-10-26: qty 20

## 2023-10-26 MED ORDER — ONDANSETRON HCL 4 MG/2ML IJ SOLN
INTRAMUSCULAR | Status: DC | PRN
  Administered 2023-10-26: 4 mg via INTRAVENOUS

## 2023-10-26 MED ORDER — DIPHENHYDRAMINE HCL 25 MG PO CAPS: 25.0000 mg | ORAL_CAPSULE | Freq: Four times a day (QID) | ORAL | Status: DC | PRN

## 2023-10-26 MED ORDER — FENTANYL-BUPIVACAINE-NACL 0.5-0.125-0.9 MG/250ML-% EP SOLN
12.0000 mL/h | EPIDURAL | Status: DC | PRN
  Administered 2023-10-26: 12 mL/h via EPIDURAL
  Filled 2023-10-26: qty 250

## 2023-10-26 MED ORDER — DIBUCAINE (PERIANAL) 1 % EX OINT: 1.0000 | TOPICAL_OINTMENT | CUTANEOUS | Status: DC | PRN

## 2023-10-26 MED ORDER — COCONUT OIL OIL
1.0000 | TOPICAL_OIL | Status: DC | PRN
  Administered 2023-10-28: 1 via TOPICAL

## 2023-10-26 MED ORDER — LIDOCAINE-EPINEPHRINE (PF) 2 %-1:200000 IJ SOLN
INTRAMUSCULAR | Status: AC
  Filled 2023-10-26: qty 20

## 2023-10-26 MED ORDER — OXYTOCIN-SODIUM CHLORIDE 30-0.9 UT/500ML-% IV SOLN: 2.5000 [IU]/h | INTRAVENOUS | Status: AC

## 2023-10-26 MED ORDER — TERBUTALINE SULFATE 1 MG/ML IJ SOLN: 0.2500 mg | Freq: Once | INTRAMUSCULAR | Status: DC | PRN

## 2023-10-26 MED ORDER — CEFAZOLIN SODIUM-DEXTROSE 2-3 GM-%(50ML) IV SOLR
INTRAVENOUS | Status: DC | PRN
  Administered 2023-10-26: 2 g via INTRAVENOUS

## 2023-10-26 MED ORDER — ZOLPIDEM TARTRATE 5 MG PO TABS: 5.0000 mg | ORAL_TABLET | Freq: Every evening | ORAL | Status: DC | PRN

## 2023-10-26 MED ORDER — PHENYLEPHRINE 80 MCG/ML (10ML) SYRINGE FOR IV PUSH (FOR BLOOD PRESSURE SUPPORT): 80.0000 ug | PREFILLED_SYRINGE | INTRAVENOUS | Status: DC | PRN

## 2023-10-26 MED ORDER — LIDOCAINE-EPINEPHRINE (PF) 2 %-1:200000 IJ SOLN
INTRAMUSCULAR | Status: DC | PRN
  Administered 2023-10-26: 5 mL via EPIDURAL
  Administered 2023-10-26: 4 mL via EPIDURAL

## 2023-10-26 MED ORDER — MAGNESIUM SULFATE 40 GM/1000ML IV SOLN
INTRAVENOUS | Status: AC
  Filled 2023-10-26: qty 1000

## 2023-10-26 MED ORDER — FENTANYL CITRATE (PF) 100 MCG/2ML IJ SOLN
INTRAMUSCULAR | Status: DC | PRN
  Administered 2023-10-26: 100 ug via EPIDURAL

## 2023-10-26 MED ORDER — ACETAMINOPHEN 500 MG PO TABS: 1000.0000 mg | ORAL_TABLET | Freq: Four times a day (QID) | ORAL | Status: DC | PRN

## 2023-10-26 MED ORDER — EPHEDRINE 5 MG/ML INJ: 10.0000 mg | INTRAVENOUS | Status: DC | PRN

## 2023-10-26 MED ORDER — SIMETHICONE 80 MG PO CHEW
80.0000 mg | CHEWABLE_TABLET | Freq: Three times a day (TID) | ORAL | Status: DC
Start: 2023-10-27 — End: 2023-10-30
  Administered 2023-10-27 – 2023-10-30 (×10): 80 mg via ORAL
  Filled 2023-10-26 (×10): qty 1

## 2023-10-26 MED ORDER — METOCLOPRAMIDE HCL 5 MG/ML IJ SOLN
10.0000 mg | Freq: Four times a day (QID) | INTRAMUSCULAR | Status: DC | PRN
  Administered 2023-10-26: 10 mg via INTRAVENOUS
  Filled 2023-10-26: qty 2

## 2023-10-26 MED ORDER — LIDOCAINE HCL (PF) 1 % IJ SOLN: 30.0000 mL | INTRAMUSCULAR | Status: DC | PRN

## 2023-10-26 MED ORDER — DEXAMETHASONE SODIUM PHOSPHATE 10 MG/ML IJ SOLN
INTRAMUSCULAR | Status: AC
  Filled 2023-10-26: qty 1

## 2023-10-26 MED ORDER — AMISULPRIDE (ANTIEMETIC) 5 MG/2ML IV SOLN: 10.0000 mg | Freq: Once | INTRAVENOUS | Status: DC | PRN

## 2023-10-26 MED ORDER — LACTATED RINGERS IV SOLN: INTRAVENOUS | Status: AC

## 2023-10-26 MED ORDER — MAGNESIUM SULFATE 40 GM/1000ML IV SOLN
2.0000 g/h | INTRAVENOUS | Status: AC
  Administered 2023-10-27: 2 g/h via INTRAVENOUS
  Filled 2023-10-26: qty 1000

## 2023-10-26 MED ORDER — OXYTOCIN-SODIUM CHLORIDE 30-0.9 UT/500ML-% IV SOLN
INTRAVENOUS | Status: AC
  Filled 2023-10-26: qty 500

## 2023-10-26 MED ORDER — MEPERIDINE HCL 25 MG/ML IJ SOLN: 6.2500 mg | INTRAMUSCULAR | Status: DC | PRN

## 2023-10-26 MED ORDER — PENICILLIN G POT IN DEXTROSE 60000 UNIT/ML IV SOLN: 3.0000 10*6.[IU] | INTRAVENOUS | Status: DC

## 2023-10-26 MED ORDER — ONDANSETRON HCL 4 MG/2ML IJ SOLN
INTRAMUSCULAR | Status: AC
  Filled 2023-10-26: qty 2

## 2023-10-26 MED ORDER — DIPHENHYDRAMINE HCL 50 MG/ML IJ SOLN: 12.5000 mg | INTRAMUSCULAR | Status: DC | PRN

## 2023-10-26 MED ORDER — ONDANSETRON HCL 4 MG/2ML IJ SOLN
4.0000 mg | Freq: Three times a day (TID) | INTRAMUSCULAR | Status: DC | PRN
  Administered 2023-10-26: 4 mg via INTRAVENOUS
  Filled 2023-10-26: qty 2

## 2023-10-26 MED ORDER — SIMETHICONE 80 MG PO CHEW: 80.0000 mg | CHEWABLE_TABLET | ORAL | Status: DC | PRN

## 2023-10-26 MED ORDER — ONDANSETRON HCL 4 MG/2ML IJ SOLN: 4.0000 mg | Freq: Four times a day (QID) | INTRAMUSCULAR | Status: DC | PRN

## 2023-10-26 MED ORDER — WITCH HAZEL-GLYCERIN EX PADS: 1.0000 | MEDICATED_PAD | CUTANEOUS | Status: DC | PRN

## 2023-10-26 MED ORDER — ACETAMINOPHEN 500 MG PO TABS
1000.0000 mg | ORAL_TABLET | Freq: Four times a day (QID) | ORAL | Status: DC
  Administered 2023-10-27 – 2023-10-30 (×14): 1000 mg via ORAL
  Filled 2023-10-26 (×15): qty 2

## 2023-10-26 MED ORDER — HYDRALAZINE HCL 20 MG/ML IJ SOLN: 10.0000 mg | INTRAMUSCULAR | Status: DC | PRN

## 2023-10-26 MED ORDER — OXYTOCIN 10 UNIT/ML IJ SOLN: 10.0000 [IU] | Freq: Once | INTRAMUSCULAR | Status: DC

## 2023-10-26 MED ORDER — SODIUM CHLORIDE 0.9 % IR SOLN
Status: DC | PRN
  Administered 2023-10-26: 1000 mL

## 2023-10-26 MED ORDER — SODIUM CHLORIDE 0.9 % IV SOLN: INTRAVENOUS | Status: DC | PRN

## 2023-10-26 MED ORDER — OXYTOCIN-SODIUM CHLORIDE 30-0.9 UT/500ML-% IV SOLN: 2.5000 [IU]/h | INTRAVENOUS | Status: DC

## 2023-10-26 MED ORDER — PHENYLEPHRINE 80 MCG/ML (10ML) SYRINGE FOR IV PUSH (FOR BLOOD PRESSURE SUPPORT)
PREFILLED_SYRINGE | INTRAVENOUS | Status: AC
  Filled 2023-10-26: qty 10

## 2023-10-26 MED ORDER — OXYCODONE HCL 5 MG/5ML PO SOLN: 5.0000 mg | Freq: Once | ORAL | Status: DC | PRN

## 2023-10-26 MED ORDER — DIPHENHYDRAMINE HCL 50 MG/ML IJ SOLN: 12.5000 mg | Freq: Four times a day (QID) | INTRAMUSCULAR | Status: DC | PRN

## 2023-10-26 MED ORDER — LACTATED RINGERS IV SOLN
500.0000 mL | INTRAVENOUS | Status: DC | PRN
  Administered 2023-10-26: 500 mL via INTRAVENOUS

## 2023-10-26 MED ORDER — ACETAMINOPHEN 10 MG/ML IV SOLN: 1000.0000 mg | Freq: Once | INTRAVENOUS | Status: DC | PRN

## 2023-10-26 MED ORDER — HYDROMORPHONE HCL 1 MG/ML IJ SOLN: 0.2000 mg | INTRAMUSCULAR | Status: DC | PRN

## 2023-10-26 MED ORDER — OXYCODONE HCL 5 MG PO TABS: 5.0000 mg | ORAL_TABLET | Freq: Once | ORAL | Status: DC | PRN

## 2023-10-26 MED ORDER — SODIUM CHLORIDE 0.9% FLUSH: 3.0000 mL | Freq: Two times a day (BID) | INTRAVENOUS | Status: DC

## 2023-10-26 MED ORDER — PRENATAL MULTIVITAMIN CH
1.0000 | ORAL_TABLET | Freq: Every day | ORAL | Status: DC
  Administered 2023-10-27 – 2023-10-30 (×4): 1 via ORAL
  Filled 2023-10-26 (×4): qty 1

## 2023-10-26 MED ORDER — PHENYLEPHRINE HCL (PRESSORS) 10 MG/ML IV SOLN
INTRAVENOUS | Status: DC | PRN
  Administered 2023-10-26: 80 ug via INTRAVENOUS

## 2023-10-26 MED ORDER — OXYCODONE HCL 5 MG PO TABS
5.0000 mg | ORAL_TABLET | ORAL | Status: DC | PRN
  Administered 2023-10-27: 5 mg via ORAL
  Administered 2023-10-28 – 2023-10-29 (×3): 10 mg via ORAL
  Filled 2023-10-26: qty 2
  Filled 2023-10-26 (×2): qty 1
  Filled 2023-10-26: qty 2
  Filled 2023-10-26: qty 1

## 2023-10-26 MED ORDER — SOD CITRATE-CITRIC ACID 500-334 MG/5ML PO SOLN
ORAL | Status: AC
  Administered 2023-10-26: 30 mL
  Filled 2023-10-26: qty 30

## 2023-10-26 MED ORDER — DEXMEDETOMIDINE HCL IN NACL 80 MCG/20ML IV SOLN
INTRAVENOUS | Status: DC | PRN
  Administered 2023-10-26: 8 ug via INTRAVENOUS
  Administered 2023-10-26: 4 ug via INTRAVENOUS

## 2023-10-26 MED ORDER — SODIUM CHLORIDE 0.9 % IV SOLN
INTRAVENOUS | Status: DC | PRN
  Administered 2023-10-26: 500 mg via INTRAVENOUS

## 2023-10-26 MED ORDER — MISOPROSTOL 50MCG HALF TABLET: 50.0000 ug | ORAL_TABLET | ORAL | Status: DC | PRN

## 2023-10-26 MED ORDER — NALOXONE HCL 4 MG/10ML IJ SOLN: 1.0000 ug/kg/h | INTRAVENOUS | Status: DC | PRN

## 2023-10-26 MED ORDER — SCOPOLAMINE 1 MG/3DAYS TD PT72
1.0000 | MEDICATED_PATCH | Freq: Once | TRANSDERMAL | Status: AC
  Administered 2023-10-26: 1.5 mg via TRANSDERMAL
  Filled 2023-10-26: qty 1

## 2023-10-26 MED ORDER — MENTHOL 3 MG MT LOZG
1.0000 | LOZENGE | OROMUCOSAL | Status: DC | PRN
Start: 2023-10-26 — End: 2023-10-30

## 2023-10-26 MED ORDER — FENTANYL CITRATE (PF) 100 MCG/2ML IJ SOLN: 25.0000 ug | INTRAMUSCULAR | Status: DC | PRN

## 2023-10-26 MED ORDER — SODIUM CHLORIDE 0.9% FLUSH: 3.0000 mL | INTRAVENOUS | Status: DC | PRN

## 2023-10-26 MED ORDER — STERILE WATER FOR IRRIGATION IR SOLN
Status: DC | PRN
  Administered 2023-10-26: 1000 mL

## 2023-10-26 MED ORDER — IBUPROFEN 600 MG PO TABS
600.0000 mg | ORAL_TABLET | Freq: Four times a day (QID) | ORAL | Status: DC
  Administered 2023-10-28 – 2023-10-30 (×11): 600 mg via ORAL
  Filled 2023-10-26 (×11): qty 1

## 2023-10-26 MED ORDER — DEXAMETHASONE SODIUM PHOSPHATE 4 MG/ML IJ SOLN
INTRAMUSCULAR | Status: DC | PRN
  Administered 2023-10-26: 10 mg via INTRAVENOUS

## 2023-10-26 MED ORDER — LABETALOL HCL 5 MG/ML IV SOLN: 40.0000 mg | INTRAVENOUS | Status: DC | PRN

## 2023-10-26 MED ORDER — SENNOSIDES-DOCUSATE SODIUM 8.6-50 MG PO TABS
2.0000 | ORAL_TABLET | Freq: Every day | ORAL | Status: DC
  Administered 2023-10-27 – 2023-10-30 (×4): 2 via ORAL
  Filled 2023-10-26 (×4): qty 2

## 2023-10-26 SURGICAL SUPPLY — 32 items
BENZOIN TINCTURE PRP APPL 2/3 (GAUZE/BANDAGES/DRESSINGS) IMPLANT
CELL SAVER LIPIGURD (MISCELLANEOUS) ×1 IMPLANT
CHLORAPREP W/TINT 26 (MISCELLANEOUS) ×2 IMPLANT
CLAMP UMBILICAL CORD (MISCELLANEOUS) ×1 IMPLANT
CLOTH BEACON ORANGE TIMEOUT ST (SAFETY) ×1 IMPLANT
DEVICE RETRIEVAL ALEXIS 14 (MISCELLANEOUS) IMPLANT
DRSG OPSITE POSTOP 4X10 (GAUZE/BANDAGES/DRESSINGS) ×1 IMPLANT
ELECT REM PT RETURN 9FT ADLT (ELECTROSURGICAL) ×1
ELECTRODE REM PT RTRN 9FT ADLT (ELECTROSURGICAL) ×1 IMPLANT
EXTRACTOR VACUUM KIWI (MISCELLANEOUS) IMPLANT
EXTRT SYSTEM ALEXIS 14CM (MISCELLANEOUS) ×1
GLOVE BIOGEL PI IND STRL 7.0 (GLOVE) ×3 IMPLANT
GLOVE ECLIPSE 6.5 STRL STRAW (GLOVE) ×1 IMPLANT
GOWN STRL REUS W/TWL LRG LVL3 (GOWN DISPOSABLE) ×2 IMPLANT
HEMOSTAT SURGICEL 4X8 (HEMOSTASIS) IMPLANT
KIT ABG SYR 3ML LUER SLIP (SYRINGE) IMPLANT
LIGASURE IMPACT 36 18CM CVD LR (INSTRUMENTS) ×1 IMPLANT
MAT PREVALON FULL STRYKER (MISCELLANEOUS) IMPLANT
NDL HYPO 25X5/8 SAFETYGLIDE (NEEDLE) IMPLANT
NEEDLE HYPO 25X5/8 SAFETYGLIDE (NEEDLE) IMPLANT
NS IRRIG 1000ML POUR BTL (IV SOLUTION) ×1 IMPLANT
PACK C SECTION WH (CUSTOM PROCEDURE TRAY) ×1 IMPLANT
PAD OB MATERNITY 4.3X12.25 (PERSONAL CARE ITEMS) ×1 IMPLANT
STRIP CLOSURE SKIN 1/2X4 (GAUZE/BANDAGES/DRESSINGS) IMPLANT
SUT MNCRL 0 VIOLET CTX 36 (SUTURE) ×2 IMPLANT
SUT PLAIN 0 NONE (SUTURE) IMPLANT
SUT PLAIN ABS 2-0 CT1 27XMFL (SUTURE) ×1 IMPLANT
SUT VIC AB 0 CT1 27XBRD ANBCTR (SUTURE) ×1 IMPLANT
SUT VIC AB 4-0 KS 27 (SUTURE) ×1 IMPLANT
TOWEL OR 17X24 6PK STRL BLUE (TOWEL DISPOSABLE) ×1 IMPLANT
TRAY FOLEY W/BAG SLVR 14FR LF (SET/KITS/TRAYS/PACK) IMPLANT
WATER STERILE IRR 1000ML POUR (IV SOLUTION) ×1 IMPLANT

## 2023-10-26 NOTE — Progress Notes (Signed)
Progress Note  42Y G2P0101 presenting in early labor and PEC, made rapid change after brief foley catheter placement. Now comfortable with epidural. Contracting on own and AROM performed for meconium (light) stained fluid. Tracing remains category II with recurrent late and variable decels. Cervix remains at 6-7cm and high fetal station with LGA EFW 9+lbs. Discussed with patient concerns for NRFHT remote from delivery and recommend proceeding with primary cesarean section. Patient agrees to plan of care. Consents signed at bedside with review of surgical risks including but not limited to bleeding, infection, damage to surrounding structures, and inherit risks of anesthesia and VTE. Pt agrees to blood transfusion if clinically indicated. OR team notified, urgent case given tracing. Prepare for cesarean section.  Gryffin Altice A Morry Veiga 10/26/23 4:22 PM

## 2023-10-26 NOTE — Transfer of Care (Signed)
Immediate Anesthesia Transfer of Care Note  Patient: Sincere Castleberry  Procedure(s) Performed: CESAREAN SECTION (Abdomen)  Patient Location: PACU  Anesthesia Type:Epidural  Level of Consciousness: awake, alert , and oriented  Airway & Oxygen Therapy: Patient Spontanous Breathing  Post-op Assessment: Report given to RN and Post -op Vital signs reviewed and stable  Post vital signs: Reviewed and stable  Last Vitals:  Vitals Value Taken Time  BP 113/86 10/26/23 1746  Temp    Pulse 73 10/26/23 1747  Resp 15 10/26/23 1747  SpO2 99 % 10/26/23 1747  Vitals shown include unfiled device data.  Last Pain:  Vitals:   10/26/23 1745  TempSrc: (P) Oral  PainSc:          Complications: No notable events documented.

## 2023-10-26 NOTE — H&P (Signed)
Teresa Cantrell is a 42 y.o. female G63P0101 [redacted]w[redacted]d presenting for admission for PEC and early labor  Patient seen in office this morning reporting increased back pain and rectal pressure with brown discharge and increase cervical mucus but no gush of fluid. She was noted to have regular contractions every 5 minutes on NST which was reactive. She has been followed for elevated BP starting at 34 weeks. She has been on Labetalol 200 mg BID with mostly moderate BP's with an occasional severe range that comes down to mild range on repeat. She had been gHTN until this week, on 12/2, had elevated urine P:Cr ratio of 0.7 but all other labs WNL. She has been asymptomatic without HA, vision changes, CP, or SOB. Worsening swelling noted with +2 edema and 5lb weight gain in the past week.   Prenatal care at [facility] with myself as primary. Pregnancy dated by LMP c/w 9w sono. Routine prenatal labs WNL except asymptomatic bacteriuria which was treated with neg TOC and asymptomatic Chlamydia with negative TOC. AMA had a low risk NIPS. Fetal anatomy scan showed normal anatomy with isolated fetal pyelectasis which resolved at 30 weeks. Most recent growth US performed last week at 36.2 showed vertex presentation with EFW 8#1 (99%) HC 98% and AC 99%with normal fluid and posterior placenta. GBS screen positive. History of SVD x1 but this was 20 years ago with different partner.   OB History     Gravida  2   Para  1   Term  0   Preterm  1   AB      Living  1      SAB      IAB      Ectopic      Multiple      Live Births  1          Past Medical History:  Diagnosis Date   Asthma    Pregnancy induced hypertension    Past Surgical History:  Procedure Laterality Date   NO PAST SURGERIES     Family History: family history is not on file. Social History:  reports that she has never smoked. She has never used smokeless tobacco. She reports that she does not drink alcohol and does not use drugs.      Maternal Diabetes: No Genetic Screening: Normal Maternal Ultrasounds/Referrals: Fetal renal pyelectasis Fetal Ultrasounds or other Referrals:  None Maternal Substance Abuse:  No Significant Maternal Medications:  Meds include: Other:  Significant Maternal Lab Results:  Group B Strep positive Other Comments:  None  Review of Systems  All other systems reviewed and are negative.  Per HPI Maternal Exam:  Uterine Assessment: Contraction strength is firm.  Contraction frequency is regular.  Abdomen: Estimated fetal weight is 9lbs.   Fetal presentation: vertex Introitus: Normal vulva. Normal vagina.  Pelvis: adequate for delivery.     Fetal Exam Fetal State Assessment: Category II - tracings are indeterminate.   Physical Exam Vitals reviewed.  Constitutional:      Appearance: She is obese.  HENT:     Head: Normocephalic.  Cardiovascular:     Rate and Rhythm: Normal rate.  Pulmonary:     Effort: Pulmonary effort is normal.  Abdominal:     Tenderness: There is no abdominal tenderness.  Genitourinary:    General: Normal vulva.  Musculoskeletal:        General: Normal range of motion.     Cervical back: Normal range of motion.  Skin:  General: Skin is warm and dry.  Neurological:     General: No focal deficit present.     Mental Status: She is alert and oriented to person, place, and time.  Psychiatric:        Mood and Affect: Mood normal.        Behavior: Behavior normal.       Blood pressure (!) 154/90, pulse 68, temperature 98.1 F (36.7 C), temperature source Oral, resp. rate 18.  Prenatal labs: ABO, Rh:    Antibody:   Rubella: Immune (06/06 0000) RPR: Nonreactive (06/06 0000)  HBsAg: Negative (06/06 0000)  HIV: Non-reactive (06/06 0000)  GBS: Positive/-- (12/02 0000)   ChemistryNo results for input(s): "NA", "K", "CL", "CO2", "GLUCOSE", "BUN", "CREATININE", "CALCIUM", "GFRNONAA", "GFRAA", "ANIONGAP" in the last 168 hours.  No results for input(s):  "PROT", "ALBUMIN", "AST", "ALT", "ALKPHOS", "BILITOT" in the last 168 hours. Hematology Recent Labs  Lab 10/26/23 1319  WBC 5.0  RBC 3.54*  HGB 12.1  HCT 34.5*  MCV 97.5  MCH 34.2*  MCHC 35.1  RDW 12.2  PLT 123*   Cardiac EnzymesNo results for input(s): "TROPONINI" in the last 168 hours. No results for input(s): "TROPIPOC" in the last 168 hours.  BNPNo results for input(s): "BNP", "PROBNP" in the last 168 hours.  DDimer No results for input(s): "DDIMER" in the last 168 hours.   Assessment/Plan: Teresa Cantrell is a 42 y.o. female G41P0101 [redacted]w[redacted]d admitted with PEC.   -Admission to LD -Routine admission labs -PEC: mild range BP on admission, has had intermittent severe range pressures in the office but none sustained, asymptomatic. PIH labs on admission show new slight elevation in LFT's but normal Cr and stable Plts at 123, Low threshold for starting Mag seizure ppx for any severe BP's , symptoms, or severe lab indicators -Patient had Foley balloon placement for labor augmentation which expelled within an hour, making rapid cervical change with own ctx pattern -EFM/Cat is Category II with minimal variability and late decels but making rapid change. Discussed with patient recommendation for cesarean delivery if tracing remains Category II and remote from delivery and patient agrees -Epidural placement now, plan for AROM after to expedite delivery -PCN started for +GBS -LGA EFW 9+ lbs shoulder precautions -Routine intrapartum care   Teresa Cantrell A Strother Everitt 10/26/2023, 2:07 PM

## 2023-10-26 NOTE — Op Note (Signed)
Teresa Cantrell 1981-03-08 [id number]  OPERATIVE NOTE  PROCEDURE: emergency primary low transverse cesarean section  PRE-OPERATIVE DIAGNOSIS:  Single intrauterine pregnancy at 36 weeks 5 days Non-reassuring fetal heart tracing Preeclampsia   POST-OPERATIVE DIAGNOSIS: As above, delivered  SURGEON: Clance Boll, DO  ASSISTANT: Dorisann Frames, CNM  FINDINGS: normal female gravid pelvic anatomy including uterus with bilateral tubes and ovaries. Single viable healthy female infant in the vertex presentation, meconium stained fluid, with apgars of 6 and 9 at the 1 and 5 minutes respectively weight 8 pounds 6 ounces, blood gas arterial pH 7.16  EBL: 1,209 cc   FLUIDS: per anesthesia   URINE OUTPUT: 700 cc  COMPLICATIONS: None  PROCEDURE IN DETAIL:  After the patient was appropriately consented she was taken to the operating room where regional anesthesia was obtained without complications. The patient was placed in the dorsal supine position with leftward tilt. Fetal heart tones were obtained and initially found to be in the 70's and quickly increased to 110s however given the recurrent late decelerations the decision was made to proceed in a stat fashion. A Betadine splash preparation was performed and the patient draped in a sterile fashion. A quick time out was performed to verify the patient information.   The scalpel was used to make a low transverse skin incision. The incision was carried down to the fascia. The fascia was incised at the midline and incision extended laterally by bluntly stretching.  The abdomen was entered by bluntly separating the rectus muscles and entering the peritoneum bluntly. The bladder blade retractor was introduced. The scalpel was used to make a low transverse uterine incision which was extended by bluntly stretching. The amniotic membranes were ruptured for meconium stained fluid. The infant was delivered in the vertex presentation maintaining adequate flexion of  the neck with loose nuchal cord x one noted. Immediate cord clamping and cutting performed. Cord blood gasses obtained. The placenta was delivered by manual extraction and sent for final pathology. An Alexis retractor was placed for visualization. The uterine cavity was cleared of any clot and debris. The hysterotomy was closed using 0-Vicryl in a running locking fashion. A second layer closure was performed using the same suture. A Surgicel hemostatic layer was placed. Adequate hemostasis of the hysterotomy was noted. The bilateral gutters were cleared of all clot and debris. Bilateral tubes and ovaries were inspected and found to be within normal limits. The underlying fascia planes were inspected and found to be hemostatic. The fascia was closed with 0-Vicryl a normal running fashion. The subcutaneous layer was irrigated with sterile saline and hemostasis obtained with the Bovie. The subcutaneous layer was closed with 2-0 plain. The skin layer was closed with 4-0 Vicryl. The patient tolerated the procedure well and was taken to the recovery room in stable condition. All instrument, needle, and sponge counts were correct.   Keanen Dohse A Zymarion Favorite 10/26/23 6:22 PM

## 2023-10-26 NOTE — Anesthesia Procedure Notes (Signed)
Epidural Patient location during procedure: OB Start time: 10/26/2023 3:06 PM End time: 10/26/2023 3:16 PM  Staffing Anesthesiologist: Leonides Grills, MD Performed: anesthesiologist   Preanesthetic Checklist Completed: patient identified, IV checked, site marked, risks and benefits discussed, monitors and equipment checked, pre-op evaluation and timeout performed  Epidural Patient position: sitting Prep: DuraPrep Patient monitoring: heart rate, cardiac monitor, continuous pulse ox and blood pressure Approach: midline Location: L3-L4 Injection technique: LOR air  Needle:  Needle type: Tuohy  Needle gauge: 17 G Needle length: 9 cm Needle insertion depth: 8 cm Catheter type: closed end flexible Catheter size: 19 Gauge Catheter at skin depth: 14 cm Test dose: negative and 1.5% lidocaine with Epi 1:200 K  Assessment Events: blood not aspirated, no cerebrospinal fluid, injection not painful, no injection resistance and negative IV test  Additional Notes Informed consent obtained prior to proceeding including risk of failure, 1% risk of PDPH, risk of minor discomfort and bruising. Discussed alternatives to epidural analgesia and patient desires to proceed.  Timeout performed pre-procedure verifying patient name, procedure, and platelet count.  Patient tolerated procedure well. Reason for block:procedure for pain

## 2023-10-26 NOTE — Anesthesia Preprocedure Evaluation (Signed)
Anesthesia Evaluation  Patient identified by MRN, date of birth, ID band Patient awake    Reviewed: Allergy & Precautions, H&P , NPO status , Patient's Chart, lab work & pertinent test results  History of Anesthesia Complications Negative for: history of anesthetic complications  Airway Mallampati: II       Dental no notable dental hx.    Pulmonary asthma    Pulmonary exam normal        Cardiovascular hypertension, Pt. on home beta blockers and Pt. on medications Normal cardiovascular exam     Neuro/Psych negative neurological ROS  negative psych ROS   GI/Hepatic negative GI ROS, Neg liver ROS,,,  Endo/Other    Class 3 obesity  Renal/GU negative Renal ROS  negative genitourinary   Musculoskeletal   Abdominal  (+) + obese  Peds  Hematology negative hematology ROS (+)   Anesthesia Other Findings   Reproductive/Obstetrics (+) Pregnancy                             Anesthesia Physical Anesthesia Plan  ASA: 3  Anesthesia Plan: Epidural   Post-op Pain Management:    Induction:   PONV Risk Score and Plan:   Airway Management Planned:   Additional Equipment:   Intra-op Plan:   Post-operative Plan:   Informed Consent: I have reviewed the patients History and Physical, chart, labs and discussed the procedure including the risks, benefits and alternatives for the proposed anesthesia with the patient or authorized representative who has indicated his/her understanding and acceptance.       Plan Discussed with:   Anesthesia Plan Comments:         Anesthesia Quick Evaluation

## 2023-10-27 ENCOUNTER — Encounter (HOSPITAL_COMMUNITY): Payer: Self-pay | Admitting: Obstetrics and Gynecology

## 2023-10-27 LAB — COMPREHENSIVE METABOLIC PANEL
ALT: 55 U/L — ABNORMAL HIGH (ref 0–44)
AST: 50 U/L — ABNORMAL HIGH (ref 15–41)
Albumin: 2.4 g/dL — ABNORMAL LOW (ref 3.5–5.0)
Alkaline Phosphatase: 114 U/L (ref 38–126)
Anion gap: 8 (ref 5–15)
BUN: 6 mg/dL (ref 6–20)
CO2: 22 mmol/L (ref 22–32)
Calcium: 8 mg/dL — ABNORMAL LOW (ref 8.9–10.3)
Chloride: 103 mmol/L (ref 98–111)
Creatinine, Ser: 0.81 mg/dL (ref 0.44–1.00)
GFR, Estimated: >60 mL/min (ref >60)
Glucose, Bld: 122 mg/dL — ABNORMAL HIGH (ref 70–99)
Potassium: 4.7 mmol/L (ref 3.5–5.1)
Sodium: 133 mmol/L — ABNORMAL LOW (ref 135–145)
Total Bilirubin: 0.6 mg/dL (ref <1.2)
Total Protein: 5.4 g/dL — ABNORMAL LOW (ref 6.5–8.1)

## 2023-10-27 LAB — CBC
HCT: 30.9 % — ABNORMAL LOW (ref 36.0–46.0)
Hemoglobin: 10.5 g/dL — ABNORMAL LOW (ref 12.0–15.0)
MCH: 33.7 pg (ref 26.0–34.0)
MCHC: 34 g/dL (ref 30.0–36.0)
MCV: 99 fL (ref 80.0–100.0)
Platelets: 118 10*3/uL — ABNORMAL LOW (ref 150–400)
RBC: 3.12 MIL/uL — ABNORMAL LOW (ref 3.87–5.11)
RDW: 12.3 % (ref 11.5–15.5)
WBC: 10.1 10*3/uL (ref 4.0–10.5)
nRBC: 0 % (ref 0.0–0.2)

## 2023-10-27 LAB — RPR: RPR Ser Ql: NONREACTIVE

## 2023-10-27 MED ORDER — NIFEDIPINE ER OSMOTIC RELEASE 30 MG PO TB24
30.0000 mg | ORAL_TABLET | Freq: Every day | ORAL | Status: DC
  Administered 2023-10-27 – 2023-10-29 (×3): 30 mg via ORAL
  Filled 2023-10-27 (×3): qty 1

## 2023-10-27 NOTE — Progress Notes (Signed)
Subjective: Postpartum Day One: Cesarean Delivery  Patient reports ongoing N/V this morning. Right now working on keeping down some breakfast and so far going ok. Last episode of N/V around 6AM when she was trying to get up to go to NICU. Has not done much ambulation being on Mag. Pain well controlled. VB appropriate. Denies any HA, vision changes, CP, or SOB. Foley catheter in place and draining. Has not yet passed gas.  Baby boy is doing well in NICU, admitted overnight due to low blood sugars.    Objective: Patient Vitals for the past 24 hrs:  BP Temp Temp src Pulse Resp SpO2  10/27/23 1028 -- -- -- -- 19 --  10/27/23 0948 -- -- -- -- 19 --  10/27/23 0855 -- -- -- -- 18 --  10/27/23 0758 134/82 -- -- 63 19 96 %  10/27/23 0528 133/78 -- -- 72 -- --  10/27/23 0400 134/88 -- -- 64 (P) 20 96 %  10/27/23 0300 (!) 146/93 -- -- 65 (P) 18 --  10/27/23 0201 (!) 152/97 -- -- 62 -- 97 %  10/27/23 0100 (!) 142/95 -- -- (!) 58 18 94 %  10/27/23 0011 (!) 147/91 -- -- (!) 59 -- --  10/26/23 2300 (!) 142/88 -- -- (!) 57 18 --  10/26/23 2149 (!) 131/90 -- -- 62 18 --  10/26/23 2003 (!) 144/95 -- -- 75 -- --  10/26/23 1930 (!) 139/98 -- -- -- 18 --  10/26/23 1915 (!) 143/95 -- -- -- 17 --  10/26/23 1905 (!) 142/97 -- -- -- 17 --  10/26/23 1900 (!) 142/97 -- -- 74 17 97 %  10/26/23 1853 129/83 97.8 F (36.6 C) Oral 75 16 96 %  10/26/23 1832 110/83 -- -- 74 16 99 %  10/26/23 1817 123/86 -- -- 69 15 99 %  10/26/23 1747 -- -- -- 82 15 98 %  10/26/23 1746 113/86 -- -- -- 15 --  10/26/23 1745 -- 97.8 F (36.6 C) Oral -- -- --  10/26/23 1626 (!) 137/45 -- -- 75 -- --  10/26/23 1600 (!) 138/91 -- -- 74 16 100 %  10/26/23 1555 -- -- -- -- -- 100 %  10/26/23 1550 -- -- -- -- -- 99 %  10/26/23 1549 -- -- -- -- -- 99 %  10/26/23 1545 -- -- -- -- -- 100 %  10/26/23 1543 (!) 144/81 98 F (36.7 C) Oral 72 17 --  10/26/23 1540 -- -- -- -- -- 99 %  10/26/23 1536 136/77 -- -- 79 16 --  10/26/23 1535 --  -- -- -- -- 98 %  10/26/23 1531 137/85 -- -- 78 -- --  10/26/23 1530 -- -- -- -- -- 99 %  10/26/23 1527 129/76 -- -- 75 -- --  10/26/23 1525 -- -- -- -- -- 99 %  10/26/23 1521 (!) 145/87 -- -- 74 -- --  10/26/23 1520 -- -- -- -- -- 99 %  10/26/23 1519 135/89 -- -- 74 -- --  10/26/23 1517 (!) 153/84 -- -- 72 -- --  10/26/23 1515 -- -- -- -- -- 99 %  10/26/23 1510 -- -- -- -- -- 99 %  10/26/23 1501 (!) 149/91 -- -- 74 -- --  10/26/23 1401 (!) 133/90 -- -- (!) 147 -- --  10/26/23 1331 (!) 154/90 -- -- 68 18 --  10/26/23 1301 (!) 146/83 -- -- 71 18 --  10/26/23 1252 (!) 147/85 98.1 F (36.7 C) Oral --  17 --    Intake/Output Summary (Last 24 hours) at 10/27/2023 1245 Last data filed at 10/27/2023 1115 Gross per 24 hour  Intake 3486.97 ml  Output 3311 ml  Net 175.97 ml     Physical Exam:  General: alert and no distress Lochia: appropriate Uterine Fundus: firm Incision: healing well, Honeycomb noted to be saturated but no active bleeding noted DVT Evaluation: Calf/Ankle edema is present.  Recent Labs    10/26/23 1806 10/27/23 0421  HGB 9.2* 10.5*  HCT 27.8* 30.9*      Latest Ref Rng & Units 10/27/2023    4:21 AM 10/26/2023    6:06 PM 10/26/2023    1:19 PM  CBC  WBC 4.0 - 10.5 K/uL 10.1  8.4  5.0   Hemoglobin 12.0 - 15.0 g/dL 16.1  9.2  09.6   Hematocrit 36.0 - 46.0 % 30.9  27.8  34.5   Platelets 150 - 400 K/uL 118  102  123       Latest Ref Rng & Units 10/27/2023    4:21 AM 10/26/2023    1:19 PM 10/18/2023    6:16 PM  CMP  Glucose 70 - 99 mg/dL 045  72  409   BUN 6 - 20 mg/dL 6  7  6    Creatinine 0.44 - 1.00 mg/dL 8.11  9.14  7.82   Sodium 135 - 145 mmol/L 133  136  135   Potassium 3.5 - 5.1 mmol/L 4.7  4.1  3.9   Chloride 98 - 111 mmol/L 103  106  108   CO2 22 - 32 mmol/L 22  21  19    Calcium 8.9 - 10.3 mg/dL 8.0  9.2  9.2   Total Protein 6.5 - 8.1 g/dL 5.4  5.9  6.2   Total Bilirubin <1.2 mg/dL 0.6  0.7  0.5   Alkaline Phos 38 - 126 U/L 114  137  113   AST 15  - 41 U/L 50  53  26   ALT 0 - 44 U/L 55  55  28      Assessment/Plan: Status post Cesarean section. Doing well postoperatively.  Continue current care. Luz Brazen [id number] POD#1 sp emergent primary cesarean delivery at [redacted]w[redacted]d for NRFHT c/b PEC 1. PEC: continue Mag seizure ppx x24hr > will DC at 1640 today. BP mostly normal to mild range elevation over night, no severe ranges. Will start Procardia 30XL every day now and monitor BP's off Mag and adjust antihypertensive regimen as needed. Repeat PIH labs this morning show stable Plts at 118 and AST/ALT 50/55, normal Cr 0.81, continue close monitoring of I/Os, significant edema, consider addition of Lasix if needed 2. Continue current postop pain regimen as ordered, pain well controlled 3. Added Scopolamine patch and IV Reglan for nausea, cont IVF hydration and monitor for improvement 4. Encourage ambulation today 5. SCD VTE ppx 6. RH POS, RI 7. LC support PRN  Continue inpatient postop care  Kenli Waldo A Lianny Molter 10/27/2023, 12:43 PM

## 2023-10-27 NOTE — Lactation Note (Signed)
This note was copied from a baby's chart.  NICU Lactation Consultation Note  Patient Name: Teresa Cantrell [id number] Date: 10/27/2023 Age:43 hours  Reason for consult: Initial assessment; NICU baby; Late-preterm 34-36.6wks; Other (Comment) (gHTN, Pre-E, AMA, hypoglycemia)  SUBJECTIVE Visited with family of 16 hours old LPI NICU female; Baby "Teresa Cantrell" was admitted to the NICU due to prematurity and hypoglycemia. Teresa Cantrell is a P2 but doesn't have a lot of experience breastfeeding, her first child is now 21 y.o. She has already initiated pumping, praised her for her efforts. She voiced she didn't get "anything" on her first pumping session. Explained that the importance of pumping this early on is mainly for breast stimulation and not to get volume. Her plan is to do both, direct breastfeeding along with pumping and bottle feeding with EBM/formula. Reviewed pumping schedule, pumping log, lactogenesis II/III and anticipatory guidelines.  OBJECTIVE Infant data: Mother's Current Feeding Choice: Breast Milk and Formula  O2 Device: Room Air  Infant feeding assessment Scale for Readiness: 2 Scale for Quality: 3   Maternal data: G2P0202 C-Section, Vacuum Assisted Has patient been taught Hand Expression?: Yes Hand Expression Comments: no colostrum noted yet Significant Breast History:: moderate breast changes during the pregnancy Current breast feeding challenges:: NICU admission Previous breastfeeding challenges?: Breast / nipple pain (she was 42 y.o and quit when breastfeeding became painful) Does the patient have breastfeeding experience prior to this delivery?: Yes How long did the patient breastfeed?: one week Pumping frequency: initiated pumping at 19 hours post-partum Pumped volume: 0 mL Flange Size: 21 Hands-free pumping top sizes: Large Teresa Cantrell) Risk factor for low/delayed milk supply:: prematurity, PPH 1209 cc, gHTN, Pre-E, AMA  Pump: Personal, Hands Free, Manual (Mom Cozy wearable,  Lansinoh DEBP at home, hand pump)  ASSESSMENT Infant: Feeding Status: Scheduled 9-12-3-6 Feeding method: Bottle; Tube/Gavage (Bolus) Nipple Type: Slow - flow  Maternal: Milk volume: Normal  INTERVENTIONS/PLAN Interventions: Interventions: Breast feeding basics reviewed; Coconut oil; Education; DEBP; NICU Pumping Log; CDC Guidelines for Breast Pump Cleaning; LC Services brochure Tools: Pump; Flanges; Coconut oil; Hands-free pumping top Pump Education: Setup, frequency, and cleaning; Milk Storage  Plan: Encouraged pumping every 3 hours, ideally 8 pumping sessions/24 hours Breast massage, hand expression and coconut oil were also encouraged prior pumping  Female visitor present. All questions and concerns answered, family to contact [facility] services PRN.  Consult Status: NICU follow-up NICU Follow-up type: New admission follow up   Teresa Cantrell 10/27/2023, 3:46 PM

## 2023-10-27 NOTE — Lactation Note (Addendum)
This note was copied from a baby's chart. Lactation Consultation Note  Patient Name: Teresa Cantrell ZOXWR'[id number] Date: 10/27/2023 Age:43 hours  Attempted to see mom but she wasn't feeling well having nausea. Attempted to see mom and she was sleeping. RN stated mom hasn't felt well at all and is very sleepy from the Mag. Baby is in the SCN being monitored for low temps and gel for low glucose. Glucose will be rechecked soon. Baby may end up going to NICU. RN stated she will set up DEBP for mom later d/t she feeling so bad.    Maternal Data    Feeding Nipple Type: Slow - flow  LATCH Score                    Lactation Tools Discussed/Used    Interventions    Discharge    Consult Status      Ayren Zumbro, Diamond Nickel 10/27/2023, 2:28 AM

## 2023-10-27 NOTE — Social Work (Signed)
Patient screened out for psychosocial assessment since none of the following apply: Psychosocial stressors documented in mother or baby's chart Gestation less than 32 weeks Code at delivery  Infant with anomalies Please contact the Clinical Social Worker if specific needs arise, by MOB's request, or if MOB scores greater than 9/yes to question 10 on Edinburgh Postpartum Depression Screen.  Vivi Barrack, MSW, LCSW Clinical Social Worker  [phone] 10/27/2023  9:22 AM

## 2023-10-27 NOTE — Anesthesia Postprocedure Evaluation (Signed)
Anesthesia Post Note  Patient: Teresa Cantrell  Procedure(s) Performed: CESAREAN SECTION (Abdomen)     Patient location during evaluation: PACU Anesthesia Type: Epidural Level of consciousness: awake Pain management: pain level controlled Vital Signs Assessment: post-procedure vital signs reviewed and stable Respiratory status: spontaneous breathing, nonlabored ventilation and respiratory function stable Cardiovascular status: blood pressure returned to baseline and stable Postop Assessment: no apparent nausea or vomiting Anesthetic complications: no   No notable events documented.  Last Vitals:  Vitals:   10/27/23 0528 10/27/23 0758  BP: 133/78 134/82  Pulse: 72 63  Resp:  19  Temp:    SpO2:  96%    Last Pain:  Vitals:   10/26/23 2010  TempSrc:   PainSc: 0-No pain                 Teresa Cantrell

## 2023-10-27 NOTE — Plan of Care (Signed)

## 2023-10-28 LAB — CBC
HCT: 25.6 % — ABNORMAL LOW (ref 36.0–46.0)
Hemoglobin: 8.8 g/dL — ABNORMAL LOW (ref 12.0–15.0)
MCH: 34 pg (ref 26.0–34.0)
MCHC: 34.4 g/dL (ref 30.0–36.0)
MCV: 98.8 fL (ref 80.0–100.0)
Platelets: 131 10*3/uL — ABNORMAL LOW (ref 150–400)
RBC: 2.59 MIL/uL — ABNORMAL LOW (ref 3.87–5.11)
RDW: 12.7 % (ref 11.5–15.5)
WBC: 7.3 10*3/uL (ref 4.0–10.5)
nRBC: 0 % (ref 0.0–0.2)

## 2023-10-28 LAB — COMPREHENSIVE METABOLIC PANEL
ALT: 45 U/L — ABNORMAL HIGH (ref 0–44)
AST: 39 U/L (ref 15–41)
Albumin: 2.4 g/dL — ABNORMAL LOW (ref 3.5–5.0)
Alkaline Phosphatase: 86 U/L (ref 38–126)
Anion gap: 11 (ref 5–15)
BUN: 9 mg/dL (ref 6–20)
CO2: 21 mmol/L — ABNORMAL LOW (ref 22–32)
Calcium: 7.9 mg/dL — ABNORMAL LOW (ref 8.9–10.3)
Chloride: 107 mmol/L (ref 98–111)
Creatinine, Ser: 0.87 mg/dL (ref 0.44–1.00)
GFR, Estimated: >60 mL/min (ref >60)
Glucose, Bld: 77 mg/dL (ref 70–99)
Potassium: 4.1 mmol/L (ref 3.5–5.1)
Sodium: 139 mmol/L (ref 135–145)
Total Bilirubin: 0.5 mg/dL (ref <1.2)
Total Protein: 5.3 g/dL — ABNORMAL LOW (ref 6.5–8.1)

## 2023-10-28 MED ORDER — SODIUM CHLORIDE 0.9% FLUSH
3.0000 mL | Freq: Two times a day (BID) | INTRAVENOUS | Status: DC
  Administered 2023-10-28 – 2023-10-29 (×3): 3 mL via INTRAVENOUS

## 2023-10-28 NOTE — Progress Notes (Signed)
  No c/o, pain controlled, ambulating, +flatus, voids w/o difficulty No h/a, vision changes, ruq pain Tearful when talking about baby, appropriate   Patient Vitals for the past 24 hrs:  BP Temp Temp src Pulse Resp SpO2  10/28/23 0826 134/72 98.3 F (36.8 C) Oral 84 16 100 %  10/28/23 0605 134/71 98.4 F (36.9 C) Oral 83 16 99 %  10/27/23 2031 127/68 (!) 97.5 F (36.4 C) Oral 80 18 99 %  10/27/23 1555 (!) 144/70 98.2 F (36.8 C) Oral 86 18 99 %  10/27/23 1221 138/80 98 F (36.7 C) Oral 69 18 99 %  10/27/23 1028 -- -- -- -- 19 --   Intake/Output Summary (Last 24 hours) at 10/28/2023 1209 Last data filed at 10/28/2023 1043 Gross per 24 hour  Intake 914.4 ml  Output 2050 ml  Net -1135.6 ml   A&ox3 Rrr Ctab Abd: soft,nt,nd, fundus firm and below umb; dressing c/d/I LE: no edema, nt bilat     Latest Ref Rng & Units 10/27/2023    4:21 AM 10/26/2023    6:06 PM 10/26/2023    1:19 PM  CBC  WBC 4.0 - 10.5 K/uL 10.1  8.4  5.0   Hemoglobin 12.0 - 15.0 g/dL 70.3  9.2  50.0   Hematocrit 36.0 - 46.0 % 30.9  27.8  34.5   Platelets 150 - 400 K/uL 118  102  123       Latest Ref Rng & Units 10/27/2023    4:21 AM 10/26/2023    1:19 PM 10/18/2023    6:16 PM  CMP  Glucose 70 - 99 mg/dL 938  72  182   BUN 6 - 20 mg/dL 6  7  6    Creatinine 0.44 - 1.00 mg/dL 9.93  7.16  9.67   Sodium 135 - 145 mmol/L 133  136  135   Potassium 3.5 - 5.1 mmol/L 4.7  4.1  3.9   Chloride 98 - 111 mmol/L 103  106  108   CO2 22 - 32 mmol/L 22  21  19    Calcium 8.9 - 10.3 mg/dL 8.0  9.2  9.2   Total Protein 6.5 - 8.1 g/dL 5.4  5.9  6.2   Total Bilirubin <1.2 mg/dL 0.6  0.7  0.5   Alkaline Phos 38 - 126 U/L 114  137  113   AST 15 - 41 U/L 50  53  26   ALT 0 - 44 U/L 55  55  28    A/p: pod 2 s/p ltcs Severe pre-e: s/p 24 hr mag sulfate, on procardia 30mg  xl daily, one elevated/mild range bp in last 24 hr; contin to follow closely now that mag off Anemia, acute d/t blood loss, significant; plan oral iron; pt  is symptomatic Low plts - improving Post op - doing well, contin care Rh pos RI Baby: boy, breast/bottle feeding, in NICU d/t prematurity/hypoglycemia; reassurance for care in NICU, encourage mother to look into resources for emotional support, etc  Teresa 42 Cantrell

## 2023-10-28 NOTE — Lactation Note (Signed)
This note was copied from a baby's chart.  NICU Lactation Consultation Note  Patient Name: Teresa Cantrell ATFTD'D Date: 10/28/2023 Age:43 hours  Reason for consult: Follow-up assessment; NICU baby; Other (Comment); Early term 63-38.6wks (gHTN, Pre-E, AMA, hypoglycemia)  SUBJECTIVE Visited with family of 59 hours old ETI NICU female; Ms. Gotch is a P2 and was pumping when entering the room, praised her for her efforts. Noticed that pumping hasn't been consistent, but she's doing her best. She might be getting discharge tomorrow. Reviewed discharge education, pump settings and the importance of consistent pumping for the onset of lactogenesis II and the prevention of engorgement.   OBJECTIVE Infant data: Mother's Current Feeding Choice: Breast Milk and Formula  O2 Device: Room Air  Infant feeding assessment Scale for Readiness: 2 Scale for Quality: 3   Maternal data: G2P0202 C-Section, Vacuum Assisted Has patient been taught Hand Expression?: Yes Hand Expression Comments: no colostrum noted yet Significant Breast History:: moderate breast changes during the pregnancy Current breast feeding challenges:: NICU admission Previous breastfeeding challenges?: Breast / nipple pain (she was 42 y.o and quit when breastfeeding became painful) Does the patient have breastfeeding experience prior to this delivery?: Yes How long did the patient breastfeed?: one week Pumping frequency: 4 times/24 hours Pumped volume: 0 mL Flange Size: 21 Hands-free pumping top sizes: Large Wallace Cullens) Risk factor for low/delayed milk supply:: prematurity, PPH 1209 cc, gHTN, Pre-E, AMA  Pump: Personal, Hands Free, Manual (Mom Cozy wearable, Lansinoh DEBP at home, hand pump)  ASSESSMENT Infant: Feeding Status: Scheduled 9-12-3-6 Feeding method: Tube/Gavage (Bolus) Nipple Type: Slow - flow  Maternal: Milk volume: Normal  INTERVENTIONS/PLAN Interventions: Interventions: Breast feeding basics reviewed; Coconut  oil; DEBP; Education Discharge Education: Engorgement and breast care Tools: Pump; Flanges; Coconut oil; Hands-free pumping top Pump Education: Setup, frequency, and cleaning; Milk Storage  Plan: Encouraged pumping every 3 hours, ideally 8 pumping sessions/24 hours Breast massage, hand expression and coconut oil were also encouraged prior pumping She'll take all pump parts to baby's room after her discharge She'll switch her pump settings from initiate to maintain mode once she starts getting 20 ml of EBM combined  No other support person at this time. All questions and concerns answered, family to contact [facility] services PRN.   Consult Status: NICU follow-up NICU Follow-up type: Maternal D/C visit   Jillianne Gamino Venetia Constable 10/28/2023, 12:26 PM

## 2023-10-29 ENCOUNTER — Inpatient Hospital Stay (HOSPITAL_COMMUNITY): Payer: No Typology Code available for payment source

## 2023-10-29 MED ORDER — NIFEDIPINE ER OSMOTIC RELEASE 60 MG PO TB24
60.0000 mg | ORAL_TABLET | Freq: Two times a day (BID) | ORAL | Status: DC
  Administered 2023-10-29 – 2023-10-30 (×2): 60 mg via ORAL
  Filled 2023-10-29 (×2): qty 1

## 2023-10-29 MED ORDER — FUROSEMIDE 20 MG PO TABS
20.0000 mg | ORAL_TABLET | Freq: Every day | ORAL | Status: DC
  Administered 2023-10-29 – 2023-10-30 (×2): 20 mg via ORAL
  Filled 2023-10-29 (×2): qty 1

## 2023-10-29 MED ORDER — NIFEDIPINE ER OSMOTIC RELEASE 30 MG PO TB24
30.0000 mg | ORAL_TABLET | Freq: Once | ORAL | Status: AC
  Administered 2023-10-29: 30 mg via ORAL
  Filled 2023-10-29: qty 1

## 2023-10-29 MED ORDER — NIFEDIPINE ER OSMOTIC RELEASE 60 MG PO TB24: 60.0000 mg | ORAL_TABLET | Freq: Every day | ORAL | Status: DC

## 2023-10-29 NOTE — Progress Notes (Signed)
Late entry for 0930   No c/o, pain controlled, +flatus Tol po, ambulating, nml lochiaa Feeling better mentally with everything but still tearful when talking about baby   Patient Vitals for the past 24 hrs:  BP Temp Temp src Pulse Resp SpO2  10/29/23 1159 (!) 151/91 98.8 F (37.1 C) Oral (!) 103 17 100 %  10/29/23 0823 (!) 140/88 98.3 F (36.8 C) Oral 87 19 99 %  10/29/23 0358 (!) 150/89 98 F (36.7 C) Oral 81 18 100 %  10/28/23 2343 (!) 144/85 99 F (37.2 C) Oral 84 18 100 %  10/28/23 1934 (!) 141/80 99.1 F (37.3 C) Oral 92 18 98 %  10/28/23 1631 (!) 145/82 98.4 F (36.9 C) Oral (!) 101 18 98 %    A&ox3 Rrr Ctab Abd: soft,nt,nd, +bs; fundus firm and belowu mb; dressing d/c/I LE: 3+ edema, nt bilat     Latest Ref Rng & Units 10/28/2023   10:08 AM 10/27/2023    4:21 AM 10/26/2023    6:06 PM  CBC  WBC 4.0 - 10.5 K/uL 7.3  10.1  8.4   Hemoglobin 12.0 - 15.0 g/dL 8.8  40.9  9.2   Hematocrit 36.0 - 46.0 % 25.6  30.9  27.8   Platelets 150 - 400 K/uL 131  118  102    A/P pod3 s/t c/s Severe pre-e: increase procardia to 60mg  xl daily; s//p mag sulfate; plan to keep until tomorrow to monitor bps; d/c home tomorrow Anemia - plan iron bid, pt is asymptomatic Low plts improving, f/u pp RI RH pos Baby in nicu LE edema, plan lasix to help though edema has improved sine yesterday

## 2023-10-30 LAB — SURGICAL PATHOLOGY

## 2023-10-30 MED ORDER — ACETAMINOPHEN 500 MG PO TABS: 1000.0000 mg | ORAL_TABLET | Freq: Four times a day (QID) | ORAL | 0 refills | Status: AC

## 2023-10-30 MED ORDER — NIFEDIPINE ER 60 MG PO TB24: 60.0000 mg | ORAL_TABLET | Freq: Two times a day (BID) | ORAL | 0 refills | Status: DC

## 2023-10-30 MED ORDER — FUROSEMIDE 20 MG PO TABS: 20.0000 mg | ORAL_TABLET | Freq: Every day | ORAL | 0 refills | Status: DC

## 2023-10-30 MED ORDER — OXYCODONE HCL 5 MG PO TABS: 5.0000 mg | ORAL_TABLET | ORAL | 0 refills | Status: AC | PRN

## 2023-10-30 MED ORDER — IBUPROFEN 600 MG PO TABS: 600.0000 mg | ORAL_TABLET | Freq: Four times a day (QID) | ORAL | 0 refills | Status: AC

## 2023-10-30 NOTE — Discharge Summary (Signed)
Postpartum Discharge Summary  Date of Service updated 10/30/23     Patient Name: Teresa Cantrell DOB: 12/11/1980 MRN: [id number]  Date of admission: 10/26/2023 Delivery date:10/26/2023 Delivering provider: Clance Boll A Date of discharge: 10/30/2023  Admitting diagnosis: Encounter for induction of labor [Z34.90] Intrauterine pregnancy: [redacted]w[redacted]d     Secondary diagnosis:  Principal Problem:   Postpartum care following cesarean delivery 12/5 Active Problems:   Encounter for induction of labor   Severe preeclampsia   Failed induction of labor - NRFHTs   Status post primary low transverse cesarean section  Additional problems: none    Discharge diagnosis: Preterm Pregnancy Delivered, Preeclampsia (severe), and Anemia                                              Post partum procedures: n/a Augmentation: AROM and IP Foley Complications: None  Hospital course: Induction of Labor With Cesarean Section   42 y.o. yo G2P0202 at [redacted]w[redacted]d was admitted to the hospital 10/26/2023 for induction of labor. Patient had a labor course significant for IOL for worsening PEC with Foley balloon placement and rapid cervical checnge. The patient went for cesarean section due to Non-Reassuring FHR. Delivery details are as follows: Membrane Rupture Time/Date: 3:54 PM,10/26/2023  Delivery Method:C-Section, Vacuum Assisted Operative Delivery:N/A Details of operation can be found in separate operative Note.  Patient had a postpartum course complicated by PEC SF and received 24hr PP Magnesium therapy. She is ambulating, tolerating a regular diet, passing flatus, and urinating well.  Patient is discharged home in stable condition on 10/30/23.    POD#4 BP well managed on Procardia 60XL BID and Lasix 20mg  every day improving LE edema.   Newborn Data: Birth date:10/26/2023 Birth time:4:40 PM Gender:Female Living status:Living Apgars:6 ,9  Weight:3790 g                               Magnesium Sulfate received: Yes:  Seizure prophylaxis BMZ received: No Rhophylac:N/A MMR:N/A T-DaP:Given prenatally  Immunizations administered:  There is no immunization history on file for this patient.  Physical exam  Vitals:   10/29/23 1723 10/29/23 2215 10/30/23 0151 10/30/23 0550  BP: (!) 143/87 126/76 118/78 137/82  Pulse: (!) 101 89 89 95  Resp: 18 18 18 18   Temp: 98.2 F (36.8 C) 98 F (36.7 C) 98 F (36.7 C) 98.3 F (36.8 C)  TempSrc: Oral Oral Oral Oral  SpO2: 99% 98% 99% 98%   General: alert and no distress Lochia: appropriate Uterine Fundus: firm Incision: Healing well with no significant drainage DVT Evaluation: No evidence of DVT seen on physical exam. Labs: Lab Results  Component Value Date   WBC 7.3 10/28/2023   HGB 8.8 (L) 10/28/2023   HCT 25.6 (L) 10/28/2023   MCV 98.8 10/28/2023   PLT 131 (L) 10/28/2023      Latest Ref Rng & Units 10/28/2023   10:08 AM  CMP  Glucose 70 - 99 mg/dL 77   BUN 6 - 20 mg/dL 9   Creatinine 3.76 - 2.83 mg/dL 1.51   Sodium 761 - 607 mmol/L 139   Potassium 3.5 - 5.1 mmol/L 4.1   Chloride 98 - 111 mmol/L 107   CO2 22 - 32 mmol/L 21   Calcium 8.9 - 10.3 mg/dL 7.9   Total Protein 6.5 -  8.1 g/dL 5.3   Total Bilirubin <1.6 mg/dL 0.5   Alkaline Phos 38 - 126 U/L 86   AST 15 - 41 U/L 39   ALT 0 - 44 U/L 45    Edinburgh Score:    10/27/2023    1:29 PM  Edinburgh Postnatal Depression Scale Screening Tool  I have been able to laugh and see the funny side of things. 0  I have looked forward with enjoyment to things. 0  I have blamed myself unnecessarily when things went wrong. 0  I have been anxious or worried for no good reason. 0  I have felt scared or panicky for no good reason. 0  Things have been getting on top of me. 0  I have been so unhappy that I have had difficulty sleeping. 0  I have felt sad or miserable. 0  I have been so unhappy that I have been crying. 0  The thought of harming myself has occurred to me. 0  Edinburgh Postnatal  Depression Scale Total 0      After visit meds:  Allergies as of 10/30/2023       Reactions   Pineapple Anaphylaxis   Needs epi pen   Citric Acid Hives, Swelling   Tolerated sodium citrate-citric acid (Oracit) with Decadron dose        Medication List     STOP taking these medications    aspirin EC 81 MG tablet   benzonatate 100 MG capsule Commonly known as: Tessalon Perles   labetalol 100 MG tablet Commonly known as: NORMODYNE       TAKE these medications    acetaminophen 500 MG tablet Commonly known as: TYLENOL Take 2 tablets (1,000 mg total) by mouth every 6 (six) hours.   albuterol 108 (90 Base) MCG/ACT inhaler Commonly known as: VENTOLIN HFA Inhale 2 puffs into the lungs every 6 (six) hours as needed for wheezing or shortness of breath.   folic acid 1 MG tablet Commonly known as: FOLVITE Take 1 mg by mouth daily.   furosemide 20 MG tablet Commonly known as: LASIX Take 1 tablet (20 mg total) by mouth daily for 5 days. Start taking on: October 31, 2023   ibuprofen 600 MG tablet Commonly known as: ADVIL Take 1 tablet (600 mg total) by mouth every 6 (six) hours.   NIFEdipine 60 MG 24 hr tablet Commonly known as: ADALAT CC Take 1 tablet (60 mg total) by mouth 2 (two) times daily.   oxyCODONE 5 MG immediate release tablet Commonly known as: Oxy IR/ROXICODONE Take 1-2 tablets (5-10 mg total) by mouth every 4 (four) hours as needed for moderate pain (pain score 4-6).   prenatal multivitamin Tabs tablet Take 1 tablet by mouth daily at 12 noon.               Discharge Care Instructions  (From admission, onward)           Start     Ordered   10/30/23 0000  Discharge wound care:       Comments: Keep incision clean and dry. May remove Honeycomb dressing by 5-7 days after cesarean section date.   10/30/23 1235             Discharge home in stable condition Infant Feeding: Bottle and Breast Infant Disposition:NICU Discharge  instruction: per After Visit Summary and Postpartum booklet. Activity: Advance as tolerated. Pelvic rest for 6 weeks.  Diet: routine diet Anticipated Birth Control: Unsure Postpartum Appointment:6 weeks Additional Postpartum  F/U: BP check 1 week Future Appointments:No future appointments. Follow up Visit:  Plan for routine 6 week postpartum visit and BP check within 1 week at [facility]    10/30/2023 Toniyah Dilmore A Krystopher Kuenzel, DO

## 2023-10-30 NOTE — Lactation Note (Signed)
This note was copied from a baby's chart.  NICU Lactation Consultation Note  Patient Name: Teresa Cantrell BJYNW'G Date: 10/30/2023 Age:43 days  Reason for consult: Follow-up assessment; NICU baby; Late-preterm 34-36.6wks; Other (Comment) (GHTN) AMA SUBJECTIVE  LC in to visit with P2 Mom of ET infant in the NICU for hypoglycemia.   Baby has been on gavage feedings of formula, but recently had a bottle.  Mom just returned from the NICU where she did STS with baby.  Encouraged Mom to pump after STS.  Mom has been consistently pumping and starting to collect small amounts of colostrum for baby.   Mom to be discharged from [facility]. Mom aware of lactation support available to her while baby is in the NICU.  Mom to ask for assistance prn.  OBJECTIVE Infant data: No data recorded O2 Device: Room Air  Infant feeding assessment Scale for Readiness: 2 Scale for Quality: 4   Maternal data: G2P0202 C-Section, Vacuum Assisted Pumping frequency: 6-8 times per 24 hrs Pumped volume: 5 mL Flange Size: 21 Hands-free pumping top sizes: Large Wallace Cullens)  Pump: Personal, Hands Free, Manual (Mom Cozy wearable, Lansinoh DEBP at home, hand pump)   Feeding Status: Scheduled 9-12-3-6 Feeding method: Bottle Nipple Type: Dr. Levert Feinstein Preemie  Maternal: Milk volume: Normal  INTERVENTIONS/PLAN Interventions: Interventions: Breast feeding basics reviewed; Skin to skin; Breast massage; Hand express; Education; DEBP Discharge Education: Engorgement and breast care Tools: Flanges; Pump Pump Education: Setup, frequency, and cleaning; Milk Storage  Plan: Consult Status: NICU follow-up NICU Follow-up type: Verify onset of copious milk; Verify absence of engorgement   Teresa Cantrell 10/30/2023, 1:20 PM

## 2023-11-06 ENCOUNTER — Ambulatory Visit (HOSPITAL_COMMUNITY): Payer: Self-pay

## 2023-11-06 ENCOUNTER — Telehealth (HOSPITAL_COMMUNITY): Payer: Self-pay | Admitting: *Deleted

## 2023-11-06 NOTE — Lactation Note (Addendum)
This note was copied from a baby's chart.  NICU Lactation Consultation Note  Patient Name: Teresa Cantrell ZOXWR'U Date: 11/06/2023 Age:43 days  Reason for consult: Follow-up assessment; NICU baby; Early term 29-38.6wks; Other (Comment); RN request (GHTN) AMA  SUBJECTIVE  LC in to visit with P2 Mom of ET infant in the NICU.  Baby is on scheduled feedings, taking 28% po.  Mom had chosen to bottle feed so not to delay baby's discharge.   Mom has been consistently pumping, but does go 6 hrs at night without pumping.  Mom's volume is currently 10-20 ml.  No fullness of her breasts noted signaling lactogenesis 2.  Mom denies every having engorgement.  Mom provided with a hands free pumping top.  RN to help at next pumping, or will call LC. Mom provided with 2 handouts: 1- Increasing milk supply in the NICU 2- [place] herbs and Rx  Encourage hands-on pumping now that she has the pumping top.  Lots of STS with baby and also offered to assist at a feeding if she would like assistance to breastfeed.  Mom requested to try to latch baby at 3pm feeding.  Mom also given information on "power-pumping" in the morning after sleeping 6 hrs.  OBJECTIVE Infant data: No data recorded O2 Device: Room Air  Infant feeding assessment Scale for Readiness(Retired-Do Not Use): 2 Scale for Quality (Retired-Do Not Use): 3   Maternal data: G2P0202 C-Section, Vacuum Assisted Pumping frequency: 7-8 times per 24 hrs. Pumped volume: 10 mL (10-20 ml) Flange Size: 21 Hands-free pumping top sizes: Large Wallace Cullens)  Pump: Personal, Hands Free, Manual (Mom Cozy wearable, Lansinoh DEBP at home, hand pump)   Feeding Status: Scheduled 9-12-3-6 Feeding method: Bottle; Tube/Gavage (Bolus) Nipple Type: Dr. Irving Burton Preemie  Maternal: Milk volume: Low  INTERVENTIONS/PLAN Interventions: Interventions: DEBP; Hand express; Breast massage; Skin to skin; Education Tools: Pump; Flanges; Hands-free pumping  top Pump Education: Setup, frequency, and cleaning; Milk Storage  Plan: Consult Status: NICU follow-up NICU Follow-up type: Weekly NICU follow up   Teresa Cantrell 11/06/2023, 11:55 AM

## 2023-11-06 NOTE — Lactation Note (Signed)
This note was copied from a baby's chart. Lactation Consultation Note  Patient Name: Teresa Cantrell ZOXWR'U Date: 11/06/2023 Age:43 days Reason for consult: Follow-up assessment;NICU baby;Early term 37-38.6wks;Other (Comment);RN request (GHTN)  LC in to assist with 3 pm feeding at the breast.  NNP in to assess baby's mouth.  White patches on tongue is thought to be thrush and baby to be started on Nystatin.  LC spoke to Mom about how contagious yeast can be and how it can lead to burning pain on her nipples.  Mom and LC decided she would wait to try to direct breastfeed baby until thrush treatment complete.  [name] 11/06/2023, 2:51 PM

## 2023-11-06 NOTE — Telephone Encounter (Signed)
11/06/2023  Name: Teresa Cantrell MRN: [id number] DOB: 1981-09-02  Reason for Call:  Transition of Care Hospital Discharge Call  Contact Status: Patient Contact Status: Complete  Language assistant needed: Interpreter Mode: Interpreter Not Needed        Follow-Up Questions: Do You Have Any Concerns About Your Health As You Heal From Delivery?: No Do You Have Any Concerns About Your Infants Health?: Infant in NICU  Edinburgh Postnatal Depression Scale:  In the Past 7 Days: I have been able to laugh and see the funny side of things.: As much as I always could I have looked forward with enjoyment to things.: As much as I ever did I have blamed myself unnecessarily when things went wrong.: No, never I have been anxious or worried for no good reason.: Yes, sometimes I have felt scared or panicky for no good reason.: No, not at all Things have been getting on top of me.: No, I have been coping as well as ever I have been so unhappy that I have had difficulty sleeping.: Not at all I have felt sad or miserable.: No, not at all I have been so unhappy that I have been crying.: Only occasionally The thought of harming myself has occurred to me.: Never Edinburgh Postnatal Depression Scale Total: 3  PHQ2-9 Depression Scale:     Discharge Follow-up: Edinburgh score requires follow up?: No Patient was advised of the following resources:: Support Group, Breastfeeding Support Group  Post-discharge interventions: Reviewed Newborn Safe Sleep Practices  Salena Saner, RN 11/06/2023 11:12

## 2023-11-13 ENCOUNTER — Ambulatory Visit (HOSPITAL_COMMUNITY): Payer: Self-pay

## 2023-11-13 NOTE — Lactation Note (Signed)
This note was copied from a baby's chart.  NICU Lactation Consultation Note  Patient Name: Teresa Cantrell ZOXWR'U Date: 11/13/2023 Age:43 wk.o.  Reason for consult: Follow-up assessment; NICU baby; Term  SUBJECTIVE P2 mom of [redacted]w[redacted]d PMA 2wk old infant consulted for management of pumping and interest in latching. Mom reports infant has been started on a new thrush treatment since starting the first treatment last Monday. Mom does desire to place baby to the breast but would like to wait for thrush treatment to be completed. Mom reports that her milk output remains low but she has now started adding in an overnight session plus pumping every 3 hours throughout the day. LC glanced at EBM in refrigerator. There was 1ml stored. Encouraged mom to continue to maintain pumping frequency and recommended trying power pumping. Explained how to power pump.  Educated on maternal nutrition and hydration. Mom is taking MotherLove Milk Plus supplement, hydrating and eating lactation cookies. LC emphasized importance of breast stimulation while consuming those items to signal milk production increase.   Provided encouragement on pumping at infant's bedside, doing STS and not to watch the pump while in session. Discussed calling for lactation support when ready to latch baby to the breast.   OBJECTIVE Infant data: Mother's Current Feeding Choice: Breast Milk and Formula  O2 Device: Room Air  Infant feeding assessment IDFTS - Readiness: 3 (MOB wanted to feed despite score) IDFTS - Quality: 3   Maternal data: [id number] C-Section, Vacuum Assisted Pumping frequency: mom reports just starting to increase pumping frequency to every 3 hours; has added an overnight session over the past day. Plans to keep up with frequency. (mom reports just starting to increase pumping frequency to every 3 hours; has added an overnight session over the past day. Plans to keep up with frequency.) Pumped volume: 1 mL Flange Size:  21  Pump: DEBP  ASSESSMENT Infant:    Feeding Status: Scheduled 8-11-2-5 Feeding method: Bottle; Tube/Gavage (Bolus) Nipple Type: Nfant Standard Flow (white)  Maternal: Milk volume: Low  INTERVENTIONS/PLAN Interventions: Interventions: Education; Breast feeding basics reviewed Pump Education: Setup, frequency, and cleaning  Plan: Consult Status: NICU follow-up NICU Follow-up type: Weekly NICU follow up (mom to call when ready to latch infant after thrush has resolved)  Plan: Continue to pump every 3 hours and to use power pumping (once in the morning and once at night) to help with milk supply increase Continue breast massage before/during pumping. Call lactation for support as needed.  Su Grand 11/13/2023, 3:17 PM

## 2023-11-17 ENCOUNTER — Inpatient Hospital Stay (HOSPITAL_COMMUNITY): Admit: 2023-11-17 | Payer: PRIVATE HEALTH INSURANCE | Admitting: Obstetrics and Gynecology

## 2023-11-22 DIAGNOSIS — Z419 Encounter for procedure for purposes other than remedying health state, unspecified: Secondary | ICD-10-CM | POA: Diagnosis not present

## 2023-12-23 DIAGNOSIS — Z419 Encounter for procedure for purposes other than remedying health state, unspecified: Secondary | ICD-10-CM | POA: Diagnosis not present

## 2024-01-04 ENCOUNTER — Other Ambulatory Visit: Payer: Self-pay | Admitting: Obstetrics and Gynecology

## 2024-01-16 NOTE — Progress Notes (Signed)
 Surgical Instructions    Your procedure is scheduled on Monday, March 3rd, 2025.   Report to [facility] Main Entrance "A" at 0530 A.M., then check in with the Admitting office.  Call this number if you have problems the morning of surgery:  [phone]  If you have any questions prior to your surgery date call [phone]: Open Monday-Friday 8am-4pm If you experience any cold or flu symptoms such as cough, fever, chills, shortness of breath, etc. between now and your scheduled surgery, please notify us at the above number.     Remember:  Do not eat after midnight the night before your surgery  You may drink clear liquids until 0430  the morning of your surgery.   Clear liquids allowed are: Water, Non-Citrus Juices (without pulp), Carbonated Beverages, Clear Tea, Black Coffee Only (NO MILK, CREAM OR POWDERED CREAMER of any kind), and Gatorade.    Take these medicines the morning of surgery with A SIP OF WATER:             norethindrone (MICRONOR)               As needed:            acetaminophen (TYLENOL)            albuterol (VENTOLIN HFA) inhaler; please bring it with you to the hospital.  As of today, STOP taking any Aspirin (unless otherwise instructed by your surgeon) Aleve, Naproxen, Ibuprofen, Motrin, Advil, Goody's, BC's, all herbal medications, fish oil, and all vitamins.                     Do NOT Smoke (Tobacco/Vaping) for 24 hours prior to your procedure.  If you use a CPAP at night, you may bring your mask/headgear for your overnight stay.   Contacts, glasses, piercing's, hearing aid's, dentures or partials may not be worn into surgery, please bring cases for these belongings.    For patients admitted to the hospital, discharge time will be determined by your treatment team.   Patients discharged the day of surgery will not be allowed to drive home, and someone needs to stay with them for 24 hours.  SURGICAL WAITING ROOM VISITATION Patients having surgery or a  procedure may have no more than 2 support people in the waiting area - these visitors may rotate.   Children under the age of 2 must have an adult with them who is not the patient. If the patient needs to stay at the hospital during part of their recovery, the visitor guidelines for inpatient rooms apply. Pre-op nurse will coordinate an appropriate time for 1 support person to accompany patient in pre-op.  This support person may not rotate.   Please refer to the [facility] website for the visitor guidelines for Inpatients (after your surgery is over and you are in a regular room).    Special instructions:   [facility]- Preparing For Surgery  Before surgery, you can play an important role. Because skin is not sterile, your skin needs to be as free of germs as possible. You can reduce the number of germs on your skin by washing with CHG (chlorahexidine gluconate) Soap before surgery.  CHG is an antiseptic cleaner which kills germs and bonds with the skin to continue killing germs even after washing.    Oral Hygiene is also important to reduce your risk of infection.  Remember - BRUSH YOUR TEETH THE MORNING OF SURGERY WITH YOUR REGULAR TOOTHPASTE  Please  do not use if you have an allergy to CHG or antibacterial soaps. If your skin becomes reddened/irritated stop using the CHG.  Do not shave (including legs and underarms) for at least 48 hours prior to first CHG shower. It is OK to shave your face.  Please follow these instructions carefully.   Shower the NIGHT BEFORE SURGERY and the MORNING OF SURGERY  If you chose to wash your hair, wash your hair first as usual with your normal shampoo.  After you shampoo, rinse your hair and body thoroughly to remove the shampoo.  Use CHG Soap as you would any other liquid soap. You can apply CHG directly to the skin and wash gently with a scrungie or a clean washcloth.   Apply the CHG Soap to your body ONLY FROM THE NECK DOWN.  Do not use on open  wounds or open sores. Avoid contact with your eyes, ears, mouth and genitals (private parts). Wash Face and genitals (private parts)  with your normal soap.   Wash thoroughly, paying special attention to the area where your surgery will be performed.  Thoroughly rinse your body with warm water from the neck down.  DO NOT shower/wash with your normal soap after using and rinsing off the CHG Soap.  Pat yourself dry with a CLEAN TOWEL.  Wear CLEAN PAJAMAS to bed the night before surgery  Place CLEAN SHEETS on your bed the night before your surgery  DO NOT SLEEP WITH PETS.   Day of Surgery: Take a shower with CHG soap. Do not wear jewelry or makeup Do not wear lotions, powders, perfumes/colognes, or deodorant. Do not shave 48 hours prior to surgery.  Men may shave face and neck. Do not bring valuables to the hospital.  [facility] is not responsible for any belongings or valuables. Do not wear nail polish, gel polish, artificial nails, or any other type of covering on natural nails (fingers and toes) If you have artificial nails or gel coating that need to be removed by a nail salon, please have this removed prior to surgery. Artificial nails or gel coating may interfere with anesthesia's ability to adequately monitor your vital signs. Wear Clean/Comfortable clothing the morning of surgery Remember to brush your teeth WITH YOUR REGULAR TOOTHPASTE.   Please read over the following fact sheets that you were given.    If you received a COVID test during your pre-op visit  it is requested that you wear a mask when out in public, stay away from anyone that may not be feeling well and notify your surgeon if you develop symptoms. If you have been in contact with anyone that has tested positive in the last 10 days please notify you surgeon.

## 2024-01-17 ENCOUNTER — Encounter (HOSPITAL_COMMUNITY)
Admission: RE | Admit: 2024-01-17 | Discharge: 2024-01-17 | Disposition: A | Discharge status: Home / Self Care | Payer: PRIVATE HEALTH INSURANCE | Source: Ambulatory Visit | Attending: Obstetrics and Gynecology | Admitting: Obstetrics and Gynecology

## 2024-01-17 ENCOUNTER — Other Ambulatory Visit: Payer: Self-pay

## 2024-01-17 ENCOUNTER — Encounter (HOSPITAL_COMMUNITY): Payer: Self-pay

## 2024-01-17 VITALS — BP 150/104 | HR 69 | Temp 98.4°F | Resp 18 | Ht 66.0 in | Wt 226.3 lb

## 2024-01-17 DIAGNOSIS — Z01810 Encounter for preprocedural cardiovascular examination: Secondary | ICD-10-CM | POA: Diagnosis present

## 2024-01-17 DIAGNOSIS — Z01818 Encounter for other preprocedural examination: Secondary | ICD-10-CM | POA: Insufficient documentation

## 2024-01-17 DIAGNOSIS — Z01812 Encounter for preprocedural laboratory examination: Secondary | ICD-10-CM | POA: Diagnosis present

## 2024-01-17 LAB — BASIC METABOLIC PANEL
Anion gap: 6 (ref 5–15)
BUN: 10 mg/dL (ref 6–20)
CO2: 25 mmol/L (ref 22–32)
Calcium: 8.9 mg/dL (ref 8.9–10.3)
Chloride: 107 mmol/L (ref 98–111)
Creatinine, Ser: 0.85 mg/dL (ref 0.44–1.00)
GFR, Estimated: >60 mL/min (ref >60)
Glucose, Bld: 83 mg/dL (ref 70–99)
Potassium: 3.9 mmol/L (ref 3.5–5.1)
Sodium: 138 mmol/L (ref 135–145)

## 2024-01-17 LAB — CBC
HCT: 37.9 % (ref 36.0–46.0)
Hemoglobin: 12.8 g/dL (ref 12.0–15.0)
MCH: 32.7 pg (ref 26.0–34.0)
MCHC: 33.8 g/dL (ref 30.0–36.0)
MCV: 96.9 fL (ref 80.0–100.0)
Platelets: 225 10*3/uL (ref 150–400)
RBC: 3.91 MIL/uL (ref 3.87–5.11)
RDW: 11.6 % (ref 11.5–15.5)
WBC: 3.6 10*3/uL — ABNORMAL LOW (ref 4.0–10.5)
nRBC: 0 % (ref 0.0–0.2)

## 2024-01-17 NOTE — Progress Notes (Signed)
 PCP - [facility] Cardiologist - denies  PPM/ICD - deneis Device Orders -  Rep Notified -   Chest x-ray - na EKG - 01/17/24 Stress Test - denies ECHO - denies Cardiac Cath - denies  Sleep Study - denies CPAP -   Fasting Blood Sugar - na Checks Blood Sugar _____ times a day  Last dose of GLP1 agonist-  na GLP1 instructions:   Blood Thinner Instructions:na Aspirin Instructions:na  ERAS Protcol -clears until 0430 PRE-SURGERY Ensure or G2- no  COVID TEST- na   Anesthesia review: yes -pt's BP elevated at PAT visit- 133/101;150/104 on recheck. Pt denied chest pain,shortness of breath,headache,double vision, nausea,swelling in legs or ankles. Patient stated she was  previously prescribed Nifedipine by Dr. Conni Elliot who told patient to stop taking med when patient's blood pressure was normal. I advised patient to contact Dr. Conni Elliot today to notify her of her BP at PAT. Patient called Dr. Lamonte Richer office and left message while she was in PAT. Areatha Keas and Charlette Caffey notified of patient's BP. They asked that patient notify PAT office of any changes Dr. Conni Elliot makes to her medicines. Pt stated she would call and I gave her the PAT office numbers.   Patient denies shortness of breath, fever, cough and chest pain at PAT appointment   All instructions explained to the patient, with a verbal understanding of the material. Patient agrees to go over the instructions while at home for a better understanding. Patient also instructed to wear a mask when out in public prior to surgery. The opportunity to ask questions was provided.

## 2024-01-20 DIAGNOSIS — Z419 Encounter for procedure for purposes other than remedying health state, unspecified: Secondary | ICD-10-CM | POA: Diagnosis not present

## 2024-01-20 NOTE — H&P (Signed)
 Teresa Cantrell is an 43 y.o. female presenting for scheduled surgery with laparoscopic bilateral salpingectomy for sterilization.  Patient is a 08M [id number] who desires permanent contraception, no plans for future pregnancies Current on POP for contraception since her postpartum visit. Approximately 3 months postpartum s/p primary cesarean section. History of 1 prior vaginal delivery. Healthy female.  PSH: c/s x1 PMH: mild asthma, rescue inhaler as needed  Menstrual History: Patient's last menstrual period was 01/05/2024 (approximate).    Past Medical History:  Diagnosis Date   Asthma    Pregnancy induced hypertension     Past Surgical History:  Procedure Laterality Date   CESAREAN SECTION N/A 10/26/2023   Procedure: CESAREAN SECTION;  Surgeon: Toy Baker, DO;  Location: [facility] LD ORS;  Service: Obstetrics;  Laterality: N/A;   NO PAST SURGERIES      No family history on file.  Social History:  reports that she has never smoked. She has never used smokeless tobacco. She reports that she does not drink alcohol and does not use drugs.  Allergies:  Allergies  Allergen Reactions   Pineapple Anaphylaxis    Needs epi pen    Citric Acid Hives and Swelling    Tolerated sodium citrate-citric acid (Oracit) with Decadron dose    No medications prior to admission.    Review of Systems  Last menstrual period 01/05/2024, currently breastfeeding. Physical Exam  No results found for this or any previous visit (from the past 24 hours).  No results found.  Assessment/Plan:  28B [id number] scheduled for laparoscopic bilateral salpingectomy for permanent sterilization  Patient extensively counseled both in the office and again in the preoperative holding unit on risks, benefits, and alternatives to surgery. She has been counseled on the permanent nature of salpingectomy and patient remains 100% certain of no desire for future pregnancies. She has been counseled on the surgical risks  including but not limited to bleeding, infection, damage to surrounding structures, VTE, and inherit risks of anesthesia. She has verbalized good understanding of these risks, questions answered, and consents signed at bedside.   -Admit to OR -NPO -SCD VTE ppx -No antibiotics indicated -Routine intra-op/postop care -Anticipate DC home today, DC instructions and follow up reviewed  Oneil Behney A Denaja Verhoeven 01/20/2024, 11:42 PM

## 2024-01-21 NOTE — Anesthesia Preprocedure Evaluation (Signed)
 Anesthesia Evaluation  Patient identified by MRN, date of birth, ID band Patient awake    Reviewed: Allergy & Precautions, NPO status , Patient's Chart, lab work & pertinent test results  History of Anesthesia Complications Negative for: history of anesthetic complications  Airway Mallampati: II  TM Distance: >3 FB Neck ROM: Full    Dental  (+) Dental Advisory Given   Pulmonary asthma (does not use inhalers) , neg sleep apnea, neg COPD, neg recent URI, neg PE   Pulmonary exam normal breath sounds clear to auscultation       Cardiovascular (-) hypertension(-) angina (-) Past MI, (-) Cardiac Stents and (-) CABG negative cardio ROS (-) dysrhythmias  Rhythm:Regular Rate:Normal     Neuro/Psych negative neurological ROS     GI/Hepatic negative GI ROS, Neg liver ROS,,,  Endo/Other  negative endocrine ROS    Renal/GU negative Renal ROS     Musculoskeletal   Abdominal  (+) + obese  Peds  Hematology negative hematology ROS (+) Lab Results      Component                Value               Date                      WBC                      3.6 (L)             01/17/2024                HGB                      12.8                01/17/2024                HCT                      37.9                01/17/2024                MCV                      96.9                01/17/2024                PLT                      225                 01/17/2024              Anesthesia Other Findings   Reproductive/Obstetrics                              Anesthesia Physical Anesthesia Plan  ASA: 2  Anesthesia Plan: General   Post-op Pain Management: Tylenol PO (pre-op)*   Induction: Intravenous  PONV Risk Score and Plan: 3 and Ondansetron, Dexamethasone, Midazolam, Scopolamine patch - Pre-op and Treatment may vary due to age or medical condition  Airway Management Planned: Oral ETT  Additional  Equipment:   Intra-op Plan:   Post-operative Plan:  Extubation in OR  Informed Consent: I have reviewed the patients History and Physical, chart, labs and discussed the procedure including the risks, benefits and alternatives for the proposed anesthesia with the patient or authorized representative who has indicated his/her understanding and acceptance.     Dental advisory given  Plan Discussed with: CRNA and Anesthesiologist  Anesthesia Plan Comments: (Risks of general anesthesia discussed including, but not limited to, sore throat, hoarse voice, chipped/damaged teeth, injury to vocal cords, nausea and vomiting, allergic reactions, lung infection, heart attack, stroke, and death. All questions answered. )         Anesthesia Quick Evaluation

## 2024-01-22 ENCOUNTER — Ambulatory Visit (HOSPITAL_COMMUNITY): Payer: Self-pay | Admitting: Physician Assistant

## 2024-01-22 ENCOUNTER — Encounter (HOSPITAL_COMMUNITY): Payer: Self-pay | Admitting: Obstetrics and Gynecology

## 2024-01-22 ENCOUNTER — Ambulatory Visit (HOSPITAL_BASED_OUTPATIENT_CLINIC_OR_DEPARTMENT_OTHER): Payer: Self-pay | Admitting: Anesthesiology

## 2024-01-22 ENCOUNTER — Other Ambulatory Visit: Payer: Self-pay

## 2024-01-22 ENCOUNTER — Ambulatory Visit (HOSPITAL_COMMUNITY)
Admission: RE | Admit: 2024-01-22 | Discharge: 2024-01-22 | Disposition: A | Discharge status: Home / Self Care | Payer: No Typology Code available for payment source | Attending: Obstetrics and Gynecology | Admitting: Obstetrics and Gynecology

## 2024-01-22 ENCOUNTER — Encounter (HOSPITAL_COMMUNITY)
Admission: RE | Disposition: A | Discharge status: Home / Self Care | Payer: Self-pay | Source: Home / Self Care | Attending: Obstetrics and Gynecology

## 2024-01-22 DIAGNOSIS — E669 Obesity, unspecified: Secondary | ICD-10-CM | POA: Insufficient documentation

## 2024-01-22 DIAGNOSIS — Z79899 Other long term (current) drug therapy: Secondary | ICD-10-CM | POA: Diagnosis not present

## 2024-01-22 DIAGNOSIS — J45909 Unspecified asthma, uncomplicated: Secondary | ICD-10-CM | POA: Diagnosis not present

## 2024-01-22 DIAGNOSIS — Z302 Encounter for sterilization: Secondary | ICD-10-CM | POA: Diagnosis not present

## 2024-01-22 DIAGNOSIS — Z6836 Body mass index (BMI) 36.0-36.9, adult: Secondary | ICD-10-CM | POA: Insufficient documentation

## 2024-01-22 DIAGNOSIS — Z01818 Encounter for other preprocedural examination: Secondary | ICD-10-CM

## 2024-01-22 HISTORY — PX: LAPAROSCOPIC TUBAL LIGATION: SHX1937

## 2024-01-22 LAB — POCT PREGNANCY, URINE: Preg Test, Ur: NEGATIVE

## 2024-01-22 SURGERY — LIGATION, FALLOPIAN TUBE, LAPAROSCOPIC
Anesthesia: General | Site: Abdomen | Laterality: Bilateral

## 2024-01-22 MED ORDER — ROCURONIUM BROMIDE 10 MG/ML (PF) SYRINGE
PREFILLED_SYRINGE | INTRAVENOUS | Status: DC | PRN
  Administered 2024-01-22: 70 mg via INTRAVENOUS

## 2024-01-22 MED ORDER — FENTANYL CITRATE (PF) 250 MCG/5ML IJ SOLN
INTRAMUSCULAR | Status: DC | PRN
  Administered 2024-01-22 (×2): 50 ug via INTRAVENOUS
  Administered 2024-01-22: 100 ug via INTRAVENOUS

## 2024-01-22 MED ORDER — ORAL CARE MOUTH RINSE: 15.0000 mL | Freq: Once | OROMUCOSAL | Status: AC

## 2024-01-22 MED ORDER — DEXAMETHASONE SODIUM PHOSPHATE 10 MG/ML IJ SOLN
INTRAMUSCULAR | Status: DC | PRN
Start: 2024-01-22 — End: 2024-01-22
  Administered 2024-01-22: 10 mg via INTRAVENOUS

## 2024-01-22 MED ORDER — PHENYLEPHRINE 80 MCG/ML (10ML) SYRINGE FOR IV PUSH (FOR BLOOD PRESSURE SUPPORT)
PREFILLED_SYRINGE | INTRAVENOUS | Status: DC | PRN
  Administered 2024-01-22: 80 ug via INTRAVENOUS

## 2024-01-22 MED ORDER — MIDAZOLAM HCL 2 MG/2ML IJ SOLN
INTRAMUSCULAR | Status: DC | PRN
  Administered 2024-01-22: 2 mg via INTRAVENOUS

## 2024-01-22 MED ORDER — OXYCODONE HCL 5 MG/5ML PO SOLN: 5.0000 mg | Freq: Once | ORAL | Status: AC | PRN

## 2024-01-22 MED ORDER — PROPOFOL 10 MG/ML IV BOLUS
INTRAVENOUS | Status: AC
  Filled 2024-01-22: qty 20

## 2024-01-22 MED ORDER — SUGAMMADEX SODIUM 200 MG/2ML IV SOLN
INTRAVENOUS | Status: AC
  Filled 2024-01-22: qty 4

## 2024-01-22 MED ORDER — MIDAZOLAM HCL 2 MG/2ML IJ SOLN
INTRAMUSCULAR | Status: AC
  Filled 2024-01-22: qty 2

## 2024-01-22 MED ORDER — POVIDONE-IODINE 10 % EX SWAB
2.0000 | Freq: Once | CUTANEOUS | Status: AC
  Administered 2024-01-22: 2 via TOPICAL

## 2024-01-22 MED ORDER — AMISULPRIDE (ANTIEMETIC) 5 MG/2ML IV SOLN: 10.0000 mg | Freq: Once | INTRAVENOUS | Status: DC | PRN

## 2024-01-22 MED ORDER — OXYCODONE HCL 5 MG PO TABS
ORAL_TABLET | ORAL | Status: AC
  Filled 2024-01-22: qty 1

## 2024-01-22 MED ORDER — PROPOFOL 10 MG/ML IV BOLUS
INTRAVENOUS | Status: DC | PRN
  Administered 2024-01-22: 200 mg via INTRAVENOUS

## 2024-01-22 MED ORDER — CHLORHEXIDINE GLUCONATE 0.12 % MT SOLN
15.0000 mL | Freq: Once | OROMUCOSAL | Status: AC
  Administered 2024-01-22: 15 mL via OROMUCOSAL
  Filled 2024-01-22: qty 15

## 2024-01-22 MED ORDER — ONDANSETRON HCL 4 MG/2ML IJ SOLN
INTRAMUSCULAR | Status: AC
  Filled 2024-01-22: qty 2

## 2024-01-22 MED ORDER — SCOPOLAMINE 1 MG/3DAYS TD PT72
1.0000 | MEDICATED_PATCH | TRANSDERMAL | Status: DC
  Administered 2024-01-22: 1.5 mg via TRANSDERMAL
  Filled 2024-01-22: qty 1

## 2024-01-22 MED ORDER — LIDOCAINE 2% (20 MG/ML) 5 ML SYRINGE
INTRAMUSCULAR | Status: DC | PRN
Start: 2024-01-22 — End: 2024-01-22
  Administered 2024-01-22: 100 mg via INTRAVENOUS

## 2024-01-22 MED ORDER — OXYCODONE HCL 5 MG PO TABS
5.0000 mg | ORAL_TABLET | Freq: Once | ORAL | Status: AC | PRN
  Administered 2024-01-22: 5 mg via ORAL

## 2024-01-22 MED ORDER — LACTATED RINGERS IV SOLN: INTRAVENOUS | Status: DC | PRN

## 2024-01-22 MED ORDER — FENTANYL CITRATE (PF) 250 MCG/5ML IJ SOLN
INTRAMUSCULAR | Status: AC
  Filled 2024-01-22: qty 5

## 2024-01-22 MED ORDER — BUPIVACAINE HCL (PF) 0.25 % IJ SOLN
INTRAMUSCULAR | Status: AC
  Filled 2024-01-22: qty 30

## 2024-01-22 MED ORDER — LIDOCAINE 2% (20 MG/ML) 5 ML SYRINGE
INTRAMUSCULAR | Status: AC
  Filled 2024-01-22: qty 5

## 2024-01-22 MED ORDER — ROCURONIUM BROMIDE 10 MG/ML (PF) SYRINGE
PREFILLED_SYRINGE | INTRAVENOUS | Status: AC
  Filled 2024-01-22: qty 10

## 2024-01-22 MED ORDER — ACETAMINOPHEN 500 MG PO TABS
1000.0000 mg | ORAL_TABLET | Freq: Once | ORAL | Status: AC
  Administered 2024-01-22: 1000 mg via ORAL
  Filled 2024-01-22: qty 2

## 2024-01-22 MED ORDER — SUGAMMADEX SODIUM 200 MG/2ML IV SOLN
INTRAVENOUS | Status: DC | PRN
  Administered 2024-01-22: 400 mg via INTRAVENOUS

## 2024-01-22 MED ORDER — FENTANYL CITRATE (PF) 100 MCG/2ML IJ SOLN: 25.0000 ug | INTRAMUSCULAR | Status: DC | PRN

## 2024-01-22 MED ORDER — BUPIVACAINE HCL (PF) 0.25 % IJ SOLN
INTRAMUSCULAR | Status: DC | PRN
  Administered 2024-01-22: 10 mL

## 2024-01-22 MED ORDER — ONDANSETRON HCL 4 MG/2ML IJ SOLN
INTRAMUSCULAR | Status: DC | PRN
  Administered 2024-01-22: 4 mg via INTRAVENOUS

## 2024-01-22 MED ORDER — DEXAMETHASONE SODIUM PHOSPHATE 10 MG/ML IJ SOLN
INTRAMUSCULAR | Status: AC
  Filled 2024-01-22: qty 2

## 2024-01-22 SURGICAL SUPPLY — 32 items
BAG COUNTER SPONGE SURGICOUNT (BAG) ×1 IMPLANT
CABLE HIGH FREQUENCY MONO STRZ (ELECTRODE) IMPLANT
CATH ROBINSON RED A/P 16FR (CATHETERS) ×1 IMPLANT
DERMABOND ADVANCED .7 DNX12 (GAUZE/BANDAGES/DRESSINGS) ×1 IMPLANT
DRSG OPSITE POSTOP 3X4 (GAUZE/BANDAGES/DRESSINGS) ×1 IMPLANT
GLOVE BIOGEL PI IND STRL 7.0 (GLOVE) ×4 IMPLANT
GLOVE ECLIPSE 6.5 STRL STRAW (GLOVE) ×1 IMPLANT
GOWN STRL REUS W/ TWL LRG LVL3 (GOWN DISPOSABLE) ×4 IMPLANT
IRRIG SUCT STRYKERFLOW 2 WTIP (MISCELLANEOUS) IMPLANT
IRRIGATION SUCT STRKRFLW 2 WTP (MISCELLANEOUS) IMPLANT
KIT PINK PAD W/HEAD ARE REST (MISCELLANEOUS) ×1 IMPLANT
KIT PINK PAD W/HEAD ARM REST (MISCELLANEOUS) ×1 IMPLANT
KIT TURNOVER KIT B (KITS) ×1 IMPLANT
LIGASURE VESSEL 5MM BLUNT TIP (ELECTROSURGICAL) IMPLANT
MANIPULATOR UTERINE 4.5 ZUMI (MISCELLANEOUS) ×1 IMPLANT
NDL INSUFFLATION 14GA 120MM (NEEDLE) IMPLANT
NEEDLE INSUFFLATION 14GA 120MM (NEEDLE) ×1 IMPLANT
PACK LAPAROSCOPY BASIN (CUSTOM PROCEDURE TRAY) ×1 IMPLANT
PROTECTOR NERVE ULNAR (MISCELLANEOUS) ×1 IMPLANT
SCISSORS LAP 5X35 DISP (ENDOMECHANICALS) IMPLANT
SET TUBE SMOKE EVAC HIGH FLOW (TUBING) ×1 IMPLANT
SHEARS HARMONIC ACE PLUS 36CM (ENDOMECHANICALS) IMPLANT
SLEEVE Z-THREAD 5X100MM (TROCAR) IMPLANT
SUT VICRYL 0 UR6 27IN ABS (SUTURE) ×1 IMPLANT
SUT VICRYL 4-0 PS2 18IN ABS (SUTURE) ×1 IMPLANT
SYR 5ML LL (SYRINGE) ×1 IMPLANT
SYS BAG RETRIEVAL 10MM (BASKET) IMPLANT
SYSTEM BAG RETRIEVAL 10MM (BASKET) IMPLANT
TOWEL GREEN STERILE FF (TOWEL DISPOSABLE) ×2 IMPLANT
TROCAR 11X100 Z THREAD (TROCAR) IMPLANT
TROCAR XCEL NON-BLD 5MMX100MML (ENDOMECHANICALS) ×1 IMPLANT
WARMER LAPAROSCOPE (MISCELLANEOUS) ×1 IMPLANT

## 2024-01-22 NOTE — Anesthesia Procedure Notes (Signed)
 Procedure Name: Intubation Date/Time: 01/22/2024 7:53 AM  Performed by: Chari Manning, RNPre-anesthesia Checklist: Patient identified, Emergency Drugs available, Suction available and Patient being monitored Patient Re-evaluated:Patient Re-evaluated prior to induction Oxygen Delivery Method: Circle System Utilized Preoxygenation: Pre-oxygenation with 100% oxygen Induction Type: IV induction Ventilation: Mask ventilation without difficulty Laryngoscope Size: Mac and 3 Grade View: Grade I Tube type: Oral Tube size: 7.0 mm Number of attempts: 1 Airway Equipment and Method: Stylet and Oral airway Placement Confirmation: ETT inserted through vocal cords under direct vision, positive ETCO2 and breath sounds checked- equal and bilateral Secured at: 23 cm Tube secured with: Tape Dental Injury: Teeth and Oropharynx as per pre-operative assessment  Comments: atrauamtic

## 2024-01-22 NOTE — Op Note (Signed)
 Teresa Cantrell 10-15-1981 [id number]   Operative Note  PROCEDURE: laparoscopic bilateral salpingectomy  PRE-OPERATIVE DIAGNOSIS: desires permanent female sterilization  POST-OPERATIVE DIAGNOSIS: desires permanent female sterilization  SURGEON: Dr. Clance Boll, DO  ASSISTANT: Chasity, RNFA  FINDINGS: pelvic exam reveals normal external female genitalia, multiparous ectocervix without lesions, laparoscopy reveals normal female pelvic anatomy including 10 week size mobile uterus with smooth contours consistent with postpartum status, normal appearing bilateral fallopian tubes and ovaries  SPECIMENS: bilateral fallopian tubes  EBL: minimal  FLUIDS: per anesthesia  COMPLICATIONS: None  PROCEDURE IN DETAIL:   After the patient was appropriately consented in the holding area, she was taken to the operating room where general anesthesia was administered without complications. The patient was placed in the dorsal lithotomy position. Bilateral arms were tucked with the appropriate barrier padding. The patient was prepped and draped in the usual sterile fashion. The bladder was trained with a catheter. An appropriate time out was performed that verified the correct patient, procedure, and surgical team.   Pelvic exam performed. A sterile speculum was inserted into the vagina and the cervix was visualized. A single tooth tenaculum was used to grasp the anterior lip of the cervix. An acorn uterine manipulator was inserted through the cervix and secured into place. Attention was turned to the abdomen.  A scalpel was used to make a small vertical skin incision just inferior to the umbilicus. The Veress needle was advanced through the skin incision until 2 clicks were appreciated. The gas was connected and turned on and a low opening pressure was noted. Pneumoperitoneum was achieved without complications. A 5 mm laparoscope and trocar sleeve was inserted through the umbilical incision and the abdomen  was entered under direct visualization. Inspection of the abdomen revealed the findings as noted above and no obvious injuries noted. The patient was placed in Trendelenburg positioning to facilitate pelvic visualization. Lower quadrant ports were placed bilaterally at points approximately 2 cm superior and 2 cm medial to the ASIS. 5 mm trocars were placed bilaterally under direct laparoscopic visualization. Starting on the left side, the LigaSure device was used to sequentially cauterize and cut the mesosalpinx just inferior to the fallopian tube and transecting the fallopian tube medially at the cornual region. Attention was then turned to the right and transection performed in a similar fashion. The fallopian tubes were removed through the lateral ports and sent for final surgical pathology. Excellent hemostasis was noted at the surgical site. The pressure was reduced and the patient laid flat and continued hemostasis noted. The gas was released from the abdomen. The skin incisions were closed with 4-0 Vicryl and skin glue. Local anesthetic using 0.25% Marcaine was injected at the incision sites. The uterine manipulator was removed. The patient tolerated the procedure well and was taken to the recovery room in stable condition. All lap, needle, and instrument counts were correct.  01/22/24 8:31 AM

## 2024-01-22 NOTE — Anesthesia Postprocedure Evaluation (Signed)
 Anesthesia Post Note  Patient: Teresa Cantrell  Procedure(s) Performed: LAPAROSCOPIC TUBAL LIGATION BY SALPINGECTOMY (Bilateral: Abdomen)     Patient location during evaluation: PACU Anesthesia Type: General Level of consciousness: awake Pain management: pain level controlled Vital Signs Assessment: post-procedure vital signs reviewed and stable Respiratory status: spontaneous breathing, nonlabored ventilation and respiratory function stable Cardiovascular status: blood pressure returned to baseline and stable Postop Assessment: no apparent nausea or vomiting Anesthetic complications: no   No notable events documented.  Last Vitals:  Vitals:   01/22/24 0548 01/22/24 0855  BP: (!) 135/94 137/88  Pulse: 75 78  Resp: 16 16  Temp: 36.7 C 36.5 C  SpO2: 99% 100%    Last Pain:  Vitals:   01/22/24 0855  TempSrc:   PainSc: Asleep                 Linton Rump

## 2024-01-22 NOTE — Transfer of Care (Signed)
 Immediate Anesthesia Transfer of Care Note  Patient: Teresa Cantrell  Procedure(s) Performed: LAPAROSCOPIC TUBAL LIGATION BY SALPINGECTOMY (Bilateral: Abdomen)  Patient Location: PACU  Anesthesia Type:General  Level of Consciousness: drowsy  Airway & Oxygen Therapy: Patient Spontanous Breathing and Patient connected to face mask oxygen  Post-op Assessment: Report given to RN and Post -op Vital signs reviewed and stable  Post vital signs: Reviewed and stable  Last Vitals:  Vitals Value Taken Time  BP 137/88 0855  Temp    Pulse 79 0854  Resp 16 0854  SpO2 100% 0854    Last Pain:  Vitals:   01/22/24 0620  TempSrc:   PainSc: 0-No pain         Complications: No notable events documented.

## 2024-01-22 NOTE — Discharge Instructions (Signed)
 DO NOT TAKE TYLENOL UNTIL AFTER 12:20P. Post Anesthesia Home Care Instructions  Activity: Get plenty of rest for the remainder of the day. A responsible adult should stay with you for 24 hours following the procedure.  For the next 24 hours, DO NOT: -Drive a car -Advertising copywriter -Drink alcoholic beverages -Take any medication unless instructed by your physician -Make any legal decisions or sign important papers.  Meals: Start with liquid foods such as gelatin or soup. Progress to regular foods as tolerated. Avoid greasy, spicy, heavy foods. If nausea and/or vomiting occur, drink only clear liquids until the nausea and/or vomiting subsides. Call your physician if vomiting continues.  Special Instructions/Symptoms: Your throat may feel dry or sore from the anesthesia or the breathing tube placed in your throat during surgery. If this causes discomfort, gargle with warm salt water. The discomfort should disappear within 24 hours.  If you had a scopolamine patch placed behind your ear for the management of post- operative nausea and/or vomiting:  1. The medication in the patch is effective for 72 hours, after which it should be removed.  Wrap patch in a tissue and discard in the trash. Wash hands thoroughly with soap and water. 2. You may remove the patch earlier than 72 hours if you experience unpleasant side effects which may include dry mouth, dizziness or visual disturbances. 3. Avoid touching the patch. Wash your hands with soap and water after contact with the patch.

## 2024-01-23 ENCOUNTER — Encounter (HOSPITAL_COMMUNITY): Payer: Self-pay | Admitting: Obstetrics and Gynecology

## 2024-01-23 LAB — SURGICAL PATHOLOGY

## 2024-03-02 DIAGNOSIS — Z419 Encounter for procedure for purposes other than remedying health state, unspecified: Secondary | ICD-10-CM | POA: Diagnosis not present

## 2024-04-01 DIAGNOSIS — Z419 Encounter for procedure for purposes other than remedying health state, unspecified: Secondary | ICD-10-CM | POA: Diagnosis not present

## 2024-04-09 ENCOUNTER — Telehealth: Admitting: Family Medicine

## 2024-04-09 DIAGNOSIS — R300 Dysuria: Secondary | ICD-10-CM

## 2024-04-09 MED ORDER — CEPHALEXIN 500 MG PO CAPS: 500.0000 mg | ORAL_CAPSULE | Freq: Two times a day (BID) | ORAL | 0 refills | Status: AC

## 2024-04-09 NOTE — Progress Notes (Signed)

## 2024-04-09 NOTE — Progress Notes (Signed)
 Message sent to patient requesting further input regarding current symptoms. Awaiting patient response.

## 2024-04-09 NOTE — Progress Notes (Signed)
 Needs to do EV for symptoms of UTI not vaginal discharge.

## 2024-04-11 ENCOUNTER — Ambulatory Visit: Admitting: Family Medicine

## 2024-05-02 DIAGNOSIS — Z419 Encounter for procedure for purposes other than remedying health state, unspecified: Secondary | ICD-10-CM | POA: Diagnosis not present

## 2024-06-01 DIAGNOSIS — Z419 Encounter for procedure for purposes other than remedying health state, unspecified: Secondary | ICD-10-CM | POA: Diagnosis not present

## 2024-07-02 DIAGNOSIS — Z419 Encounter for procedure for purposes other than remedying health state, unspecified: Secondary | ICD-10-CM | POA: Diagnosis not present

## 2024-08-02 DIAGNOSIS — Z419 Encounter for procedure for purposes other than remedying health state, unspecified: Secondary | ICD-10-CM | POA: Diagnosis not present
